# Patient Record
Sex: Male | Born: 2000 | Hispanic: Yes | Marital: Single | Smoking: Never smoker
Health system: Southern US, Community
[De-identification: ages and names within clinical notes are randomized; demographics above are authoritative.]

## PROBLEM LIST (undated history)

## (undated) ENCOUNTER — Ambulatory Visit (HOSPITAL_COMMUNITY): Admission: EM | Payer: Self-pay | Source: Home / Self Care

## (undated) DIAGNOSIS — K219 Gastro-esophageal reflux disease without esophagitis: Secondary | ICD-10-CM

## (undated) HISTORY — DX: Gastro-esophageal reflux disease without esophagitis: K21.9

---
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
## 2021-04-21 ENCOUNTER — Other Ambulatory Visit: Payer: Self-pay

## 2021-04-21 ENCOUNTER — Ambulatory Visit (INDEPENDENT_AMBULATORY_CARE_PROVIDER_SITE_OTHER): Payer: Medicaid Other

## 2021-04-21 DIAGNOSIS — Z23 Encounter for immunization: Secondary | ICD-10-CM

## 2021-04-21 NOTE — Progress Notes (Signed)
    [facility]  Name:  Timothy Pacheco    MRN: [id number] DOB: [birth year]   04/21/2021  Mr. Timothy Pacheco was observed post [facility] immunization for 15 minutes without incident. He was provided with Vaccine Information Sheet and instruction to access the V-Safe system.   Mr. Timothy Pacheco was instructed to call 911 with any severe reactions post vaccine: Difficulty breathing  Swelling of face and throat  A fast heartbeat  A bad rash all over body  Dizziness and weakness

## 2021-06-02 ENCOUNTER — Ambulatory Visit: Payer: Self-pay

## 2021-10-16 ENCOUNTER — Ambulatory Visit (HOSPITAL_COMMUNITY)
Admission: EM | Admit: 2021-10-16 | Discharge: 2021-10-16 | Disposition: A | Discharge status: Home / Self Care | Payer: Medicaid Other | Attending: Internal Medicine | Admitting: Internal Medicine

## 2021-10-16 ENCOUNTER — Ambulatory Visit (INDEPENDENT_AMBULATORY_CARE_PROVIDER_SITE_OTHER): Payer: Medicaid Other

## 2021-10-16 ENCOUNTER — Encounter (HOSPITAL_COMMUNITY): Payer: Self-pay | Admitting: *Deleted

## 2021-10-16 ENCOUNTER — Other Ambulatory Visit: Payer: Self-pay

## 2021-10-16 DIAGNOSIS — J039 Acute tonsillitis, unspecified: Secondary | ICD-10-CM | POA: Diagnosis present

## 2021-10-16 DIAGNOSIS — R634 Abnormal weight loss: Secondary | ICD-10-CM | POA: Diagnosis not present

## 2021-10-16 DIAGNOSIS — R058 Other specified cough: Secondary | ICD-10-CM | POA: Insufficient documentation

## 2021-10-16 DIAGNOSIS — R042 Hemoptysis: Secondary | ICD-10-CM | POA: Insufficient documentation

## 2021-10-16 DIAGNOSIS — Z20822 Contact with and (suspected) exposure to covid-19: Secondary | ICD-10-CM | POA: Diagnosis not present

## 2021-10-16 MED ORDER — BENZONATATE 200 MG PO CAPS: 200.0000 mg | ORAL_CAPSULE | Freq: Three times a day (TID) | ORAL | 0 refills | Status: DC | PRN

## 2021-10-16 MED ORDER — FEXOFENADINE HCL 180 MG PO TABS: 180.0000 mg | ORAL_TABLET | Freq: Every day | ORAL | 0 refills | Status: DC

## 2021-10-16 MED ORDER — PREDNISONE 50 MG PO TABS: ORAL_TABLET | ORAL | 0 refills | Status: DC

## 2021-10-16 NOTE — ED Provider Notes (Signed)
?[facility] ? ? ? ?CSN: [id number] ?Arrival date & time: 10/16/21  1656 ? ? ?  ? ?History   ?Chief Complaint ?Chief Complaint  ?Patient presents with  ? Cough  ? Sore Throat  ? ? ?HPI ?Timothy Pacheco is a 21 y.o. male who presents due to having cough x 2 weeks with clear to light yellow production and this am when brushing his teeth he hit his throat and spit blood with clear mucous. He admits of having wt loss without trying and has had poor appetite in the past week. One night had night sweats. Had mono in January and has recovered fine.  ? ? ? ?History reviewed. No pertinent past medical history. ? ?There are no problems to display for this patient. ? ? ?History reviewed. No pertinent surgical history. ? ? ? ? ?Home Medications   ? ?Prior to Admission medications   ?Medication Sig Start Date End Date Taking? Authorizing Provider  ?benzonatate (TESSALON) 200 MG capsule Take 1 capsule (200 mg total) by mouth 3 (three) times daily as needed for cough. 10/16/21  Yes Rodriguez-Southworth, Nettie Elm, PA-C  ?fexofenadine (ALLEGRA ALLERGY) 180 MG tablet Take 1 tablet (180 mg total) by mouth daily. 10/16/21  Yes Rodriguez-Southworth, Nettie Elm, PA-C  ?predniSONE (DELTASONE) 50 MG tablet One qd 10/16/21  Yes Rodriguez-Southworth, Nettie Elm, PA-C  ? ? ?Family History ?Family History  ?Problem Relation Age of Onset  ? Healthy Father   ? ? ?Social History ?Social History  ? ?Tobacco Use  ? Smoking status: Never  ? Smokeless tobacco: Current  ? Tobacco comments:  ?  Vapes   ?Substance Use Topics  ? Alcohol use: Yes  ? Drug use: Yes  ?  Types: Marijuana  ?  Comment: vapes  ? ? ? ?Allergies   ?Patient has no known allergies. ? ? ?Review of Systems ?Review of Systems  ?Constitutional:  Positive for appetite change and diaphoresis. Negative for activity change, chills and fever.  ?HENT:  Negative for congestion, sore throat and trouble swallowing.   ?Eyes:  Negative for discharge.  ?Respiratory:  Positive for cough.    ?Musculoskeletal:  Negative for myalgias.  ?Skin:  Negative for rash.  ?Neurological:  Negative for headaches.  ? ? ?Physical Exam ?Triage Vital Signs ?ED Triage Vitals  ?Enc Vitals Group  ?   BP 10/16/21 1825 119/80  ?   Pulse Rate 10/16/21 1825 66  ?   Resp 10/16/21 1825 16  ?   Temp 10/16/21 1825 98.1 ?F (36.7 ?C)  ?   Temp src --   ?   SpO2 10/16/21 1825 98 %  ?   Weight --   ?   Height --   ?   Head Circumference --   ?   Peak Flow --   ?   Pain Score 10/16/21 1823 2  ?   Pain Loc --   ?   Pain Edu? --   ?   Excl. in GC? --   ? ?No data found. ? ?Updated Vital Signs ?BP 119/80   Pulse 66   Temp 98.1 ?F (36.7 ?C)   Resp 16   SpO2 98%  ? ?Visual Acuity ?Right Eye Distance:   ?Left Eye Distance:   ?Bilateral Distance:   ? ?Right Eye Near:   ?Left Eye Near:    ?Bilateral Near:    ? ?Physical Exam ?Constitutional:   ?   General: He is not in acute distress. ?   Appearance: He is  normal weight.  ?HENT:  ?   Right Ear: Tympanic membrane and ear canal normal.  ?   Left Ear: Tympanic membrane and ear canal normal.  ?   Mouth/Throat:  ?   Pharynx: Pharyngeal swelling present. No posterior oropharyngeal erythema or uvula swelling.  ?   Tonsils: No tonsillar exudate or tonsillar abscesses. 2+ on the right. 3+ on the left.  ?   Comments: L tonsil is swollen ?Eyes:  ?   Conjunctiva/sclera: Conjunctivae normal.  ?Musculoskeletal:  ?   Cervical back: Neck supple.  ?Lymphadenopathy:  ?   Cervical: No cervical adenopathy.  ?Neurological:  ?   Mental Status: He is alert and oriented to person, place, and time.  ?Psychiatric:     ?   Mood and Affect: Mood normal.     ?   Behavior: Behavior normal.  ? ? ? ?UC Treatments / Results  ?Labs ?(all labs ordered are listed, but only abnormal results are displayed) ?Labs Reviewed  ?SARS CORONAVIRUS 2 (TAT 6-24 HRS)  ? ? ?EKG ? ? ?Radiology ?DG Chest 2 View ? ?Result Date: 10/16/2021 ?CLINICAL DATA:  Throat swelling. EXAM: CHEST - 2 VIEW COMPARISON:  None. FINDINGS: The heart size and  mediastinal contours are within normal limits. Both lungs are clear. The visualized skeletal structures are unremarkable. IMPRESSION: No active cardiopulmonary disease. Electronically Signed   By: Aram Candela M.D.   On: 10/16/2021 19:31   ? ?Procedures ?Procedures (including critical care time) ? ?Medications Ordered in UC ?Medications - No data to display ? ?Initial Impression / Assessment and Plan / UC Course  ?I have reviewed the triage vital signs and the nursing notes. ?Pertinent  imaging results that were available during my care of the patient were reviewed by me and considered in my medical decision making (see chart for details). ?Tonsillitis I believe is from post nasal drainage since they are not red or is in pain' ?Cough with episodic hemoptysis. Normal CXR, must have been just from his tonsil injury.  ?I placed him on Allegra, Tessalon and Prednisone as noted.  ?  ? ? ?Final Clinical Impressions(s) / UC Diagnoses  ? ?Final diagnoses:  ?Acute tonsillitis, unspecified etiology  ?Other cough  ?Hemoptysis  ? ? ? ?Discharge Instructions   ? ?  ?The prednisone is to help the tonsil swelling ?The tessalon is for the cough  ?Your exam does not show any sings of infection ?Stay quarantined until the covid test is back ?The allegra is for post nasal drainage ? ? ? ? ?ED Prescriptions   ? ? Medication Sig Dispense Auth. Provider  ? predniSONE (DELTASONE) 50 MG tablet One qd 5 tablet Rodriguez-Southworth, Nettie Elm, PA-C  ? benzonatate (TESSALON) 200 MG capsule Take 1 capsule (200 mg total) by mouth 3 (three) times daily as needed for cough. 30 capsule Rodriguez-Southworth, Xavion Muscat, PA-C  ? fexofenadine (ALLEGRA ALLERGY) 180 MG tablet Take 1 tablet (180 mg total) by mouth daily. 30 tablet Rodriguez-Southworth, Nettie Elm, PA-C  ? ?  ? ?PDMP not reviewed this encounter. ?  ?Garey Ham, PA-C ?10/16/21 1947 ? ?

## 2021-10-16 NOTE — ED Triage Notes (Signed)
Pt reports cough for 2 weeks . Pt reports having Mono maybe in January per Pt and now has swelling in throat. ?

## 2021-10-16 NOTE — Discharge Instructions (Addendum)
The prednisone is to help the tonsil swelling ?The tessalon is for the cough  ?Your exam does not show any sings of infection ?Stay quarantined until the covid test is back ?The allegra is for post nasal drainage ?

## 2021-10-17 LAB — SARS CORONAVIRUS 2 (TAT 6-24 HRS): SARS Coronavirus 2: NEGATIVE

## 2022-09-12 ENCOUNTER — Encounter (HOSPITAL_COMMUNITY): Payer: Self-pay

## 2022-09-12 ENCOUNTER — Ambulatory Visit (HOSPITAL_COMMUNITY)
Admission: EM | Admit: 2022-09-12 | Discharge: 2022-09-12 | Disposition: A | Discharge status: Home / Self Care | Payer: Medicaid Other | Attending: Family Medicine | Admitting: Family Medicine

## 2022-09-12 DIAGNOSIS — J069 Acute upper respiratory infection, unspecified: Secondary | ICD-10-CM | POA: Diagnosis not present

## 2022-09-12 DIAGNOSIS — U071 COVID-19: Secondary | ICD-10-CM | POA: Insufficient documentation

## 2022-09-12 DIAGNOSIS — R197 Diarrhea, unspecified: Secondary | ICD-10-CM | POA: Diagnosis not present

## 2022-09-12 MED ORDER — ONDANSETRON 4 MG PO TBDP: 4.0000 mg | ORAL_TABLET | Freq: Three times a day (TID) | ORAL | 0 refills | Status: DC | PRN

## 2022-09-12 NOTE — Discharge Instructions (Addendum)
Ondansetron dissolved in the mouth every 8 hours as needed for nausea or vomiting. Clear liquids and bland things to eat.   You have been swabbed for COVID, and the test will result in the next 24 hours. Our staff will call you if positive. If the COVID test is positive, you should quarantine for 5 days from the start of your symptoms.  On days 6-10 from the start of your illness, you should wear a mask if out in public.  DayQuil/NyQuil for your congestion and cough.

## 2022-09-12 NOTE — ED Provider Notes (Signed)
[facility]    CSN: [id number] Arrival date & time: 09/12/22  1721      History   Chief Complaint Chief Complaint  Patient presents with   Fever   Cough   Nasal Congestion    HPI Timothy Pacheco is a 22 y.o. male.    Fever Associated symptoms: cough   Cough Associated symptoms: fever    Here for myalgia and cough and congestion.  Symptoms began yesterday January 30.  He has had some fever but it has gone away as of this morning.  He has had nausea but no vomiting so far.  He has had some diarrhea that he actually thinks is due to being lactose intolerant.  He found out after the fact that he was exposed to someone who later tested positive for COVID.  This exposure was on January 27.  History reviewed. No pertinent past medical history.  There are no problems to display for this patient.   History reviewed. No pertinent surgical history.     Home Medications    Prior to Admission medications   Medication Sig Start Date End Date Taking? Authorizing Provider  ondansetron (ZOFRAN-ODT) 4 MG disintegrating tablet Take 1 tablet (4 mg total) by mouth every 8 (eight) hours as needed for nausea or vomiting. 09/12/22  Yes Raiden Yearwood, Gwenlyn Perking, MD    Family History Family History  Problem Relation Age of Onset   Healthy Father     Social History Social History   Tobacco Use   Smoking status: Never   Smokeless tobacco: Current   Tobacco comments:    Vapes   Vaping Use   Vaping Use: Never used  Substance Use Topics   Alcohol use: Yes   Drug use: Yes    Types: Marijuana    Comment: vapes     Allergies   Patient has no known allergies.   Review of Systems Review of Systems  Constitutional:  Positive for fever.  Respiratory:  Positive for cough.      Physical Exam Triage Vital Signs ED Triage Vitals  Enc Vitals Group     BP 09/12/22 1829 (!) 145/76     Pulse Rate 09/12/22 1829 77     Resp 09/12/22 1829 16     Temp 09/12/22 1829  98.6 F (37 C)     Temp Source 09/12/22 1829 Oral     SpO2 09/12/22 1829 98 %     Weight --      Height --      Head Circumference --      Peak Flow --      Pain Score 09/12/22 1828 0     Pain Loc --      Pain Edu? --      Excl. in [place]? --    No data found.  Updated Vital Signs BP (!) 145/76 (BP Location: Left Arm)   Pulse 77   Temp 98.6 F (37 C) (Oral)   Resp 16   SpO2 98%   Visual Acuity Right Eye Distance:   Left Eye Distance:   Bilateral Distance:    Right Eye Near:   Left Eye Near:    Bilateral Near:     Physical Exam Vitals reviewed.  Constitutional:      General: He is not in acute distress.    Appearance: He is not toxic-appearing.  HENT:     Right Ear: Tympanic membrane and ear canal normal.     Left Ear: Tympanic  membrane and ear canal normal.     Nose: Nose normal.     Mouth/Throat:     Mouth: Mucous membranes are moist.     Comments: There is white and clear mucus draining in the oropharynx.  Tonsils are 2+ hypertrophied, but there is no erythema or asymmetry. Eyes:     Extraocular Movements: Extraocular movements intact.     Conjunctiva/sclera: Conjunctivae normal.     Pupils: Pupils are equal, round, and reactive to light.  Cardiovascular:     Rate and Rhythm: Normal rate and regular rhythm.     Heart sounds: No murmur heard. Pulmonary:     Effort: No respiratory distress.     Breath sounds: No stridor. No wheezing, rhonchi or rales.  Musculoskeletal:     Cervical back: Neck supple.  Lymphadenopathy:     Cervical: No cervical adenopathy.  Skin:    Capillary Refill: Capillary refill takes less than 2 seconds.     Coloration: Skin is not jaundiced or pale.  Neurological:     General: No focal deficit present.     Mental Status: He is alert and oriented to person, place, and time.  Psychiatric:        Behavior: Behavior normal.      UC Treatments / Results  Labs (all labs ordered are listed, but only abnormal results are  displayed) Labs Reviewed  SARS CORONAVIRUS 2 (TAT 6-24 HRS)    EKG   Radiology No results found.  Procedures Procedures (including critical care time)  Medications Ordered in UC Medications - No data to display  Initial Impression / Assessment and Plan / UC Course  I have reviewed the triage vital signs and the nursing notes.  Pertinent labs & imaging results that were available during my care of the patient were reviewed by me and considered in my medical decision making (see chart for details).        COVID swab was done today and if positive he will know if he needs to quarantine.  Zofran is sent for his nausea Final Clinical Impressions(s) / UC Diagnoses   Final diagnoses:  Viral upper respiratory tract infection     Discharge Instructions      Ondansetron dissolved in the mouth every 8 hours as needed for nausea or vomiting. Clear liquids and bland things to eat.   You have been swabbed for COVID, and the test will result in the next 24 hours. Our staff will call you if positive. If the COVID test is positive, you should quarantine for 5 days from the start of your symptoms.  On days 6-10 from the start of your illness, you should wear a mask if out in public.  DayQuil/NyQuil for your congestion and cough.     ED Prescriptions     Medication Sig Dispense Auth. Provider   ondansetron (ZOFRAN-ODT) 4 MG disintegrating tablet Take 1 tablet (4 mg total) by mouth every 8 (eight) hours as needed for nausea or vomiting. 10 tablet Windy Carina Gwenlyn Perking, MD      PDMP not reviewed this encounter.   Barrett Henle, MD 09/12/22 [phone]

## 2022-09-12 NOTE — ED Triage Notes (Signed)
Pt states that started yesterday. Fever, cough and stuffy nose. Was told he wa in contact with someone who had covid. Dayquil and Nyquil.

## 2022-09-13 LAB — SARS CORONAVIRUS 2 (TAT 6-24 HRS): SARS Coronavirus 2: POSITIVE — AB

## 2022-11-05 ENCOUNTER — Ambulatory Visit (HOSPITAL_COMMUNITY)
Admission: EM | Admit: 2022-11-05 | Discharge: 2022-11-05 | Disposition: A | Discharge status: Home / Self Care | Payer: Medicaid Other

## 2022-11-05 ENCOUNTER — Other Ambulatory Visit: Payer: Self-pay

## 2022-11-05 ENCOUNTER — Encounter (HOSPITAL_COMMUNITY): Payer: Self-pay | Admitting: *Deleted

## 2022-11-05 DIAGNOSIS — J351 Hypertrophy of tonsils: Secondary | ICD-10-CM

## 2022-11-05 NOTE — Discharge Instructions (Addendum)
To find a primary care provider go to [email or web address] and follow the prompts to schedule a new patient appointment for primary care.  Someone from [facility] health will be reaching out to you either via MyChart or phone call to help you set up a primary care provider appointment as well.  It is very important to have a primary care provider to manage your overall health and prevent/manage chronic medical conditions.   Once you have your appointment with your primary care provider, they will send a referral to an ear nose and throat provider so that you can be evaluated by a specialist for your persistent tonsil swelling.   No indication for antibiotics or steroids today.  Go to the ER if you have the following symptoms: Can't swallow your saliva or water, severe throat pain with fever, worsening changes in your voice sounds, and inability to swallow.   Return to urgent care as needed.  I hope you feel better!

## 2022-11-05 NOTE — ED Notes (Addendum)
Pt has a scheduled appt for [facility] and [place] [phone] On June 5th at 1:50pm

## 2022-11-05 NOTE — ED Triage Notes (Signed)
Pt reports the tonsils are swollen Left side greater than RT. Pt reports the Lt tonsil blocks food and is the size of a golf ball.

## 2022-11-05 NOTE — ED Provider Notes (Signed)
[facility]    CSN: [id number] Arrival date & time: 11/05/22  1331      History   Chief Complaint Chief Complaint  Patient presents with   Sore Throat    HPI Timothy Pacheco is a 22 y.o. male.   Patient presents to urgent care for evaluation of tonsillar swelling for the last 1 year.  He they state they were seen at urgent care 1 year ago and diagnosed with mono and COVID-19.  Ever since then, tonsils have been swollen especially the left tonsil.  They state the left tonsil has grown in size over the last several months and has started to affect speech.  Reports pain to overlying skin of the anterior neck and is unsure if this is due to tonsil swelling or lymph node swelling.  No recent fever, chills, sore throat, nasal congestion, viral URI symptoms, cough, acid reflux, epigastric abdominal discomfort, nausea, vomiting, inability to maintain secretions, or recent antibiotic/steroid use.  They have not been taking any over-the-counter medications to help with symptoms as the throat does not hurt.  Eating and drinking normally without difficulty.  They state they have noticed changes in their voice sounds when singing and have noticed that voice is more muffled than normal.    Sore Throat    History reviewed. No pertinent past medical history.  There are no problems to display for this patient.   History reviewed. No pertinent surgical history.     Home Medications    Prior to Admission medications   Medication Sig Start Date End Date Taking? Authorizing Provider  ondansetron (ZOFRAN-ODT) 4 MG disintegrating tablet Take 1 tablet (4 mg total) by mouth every 8 (eight) hours as needed for nausea or vomiting. 09/12/22   Windy Carina, Gwenlyn Perking, MD    Family History Family History  Problem Relation Age of Onset   Healthy Father     Social History Social History   Tobacco Use   Smoking status: Never   Smokeless tobacco: Current   Tobacco comments:     Vapes   Vaping Use   Vaping Use: Never used  Substance Use Topics   Alcohol use: Yes   Drug use: Yes    Types: Marijuana    Comment: vapes     Allergies   Patient has no known allergies.   Review of Systems Review of Systems Per HPI  Physical Exam Triage Vital Signs ED Triage Vitals  Enc Vitals Group     BP 11/05/22 1530 117/65     Pulse Rate 11/05/22 1530 74     Resp 11/05/22 1530 18     Temp 11/05/22 1530 98.9 F (37.2 C)     Temp src --      SpO2 11/05/22 1530 95 %     Weight --      Height --      Head Circumference --      Peak Flow --      Pain Score 11/05/22 1528 0     Pain Loc --      Pain Edu? --      Excl. in [place]? --    No data found.  Updated Vital Signs BP 117/65   Pulse 74   Temp 98.9 F (37.2 C)   Resp 18   SpO2 95%   Visual Acuity Right Eye Distance:   Left Eye Distance:   Bilateral Distance:    Right Eye Near:   Left Eye Near:  Bilateral Near:     Physical Exam Vitals and nursing note reviewed.  Constitutional:      Appearance: He is not ill-appearing or toxic-appearing.  HENT:     Head: Normocephalic and atraumatic.     Right Ear: Hearing, tympanic membrane, ear canal and external ear normal.     Left Ear: Hearing, tympanic membrane, ear canal and external ear normal.     Nose: Nose normal.     Mouth/Throat:     Lips: Pink.     Mouth: Mucous membranes are moist. No injury.     Tongue: No lesions. Tongue does not deviate from midline.     Palate: No mass and lesions.     Pharynx: Oropharynx is clear. Uvula midline. No pharyngeal swelling, oropharyngeal exudate, posterior oropharyngeal erythema or uvula swelling.     Tonsils: No tonsillar exudate or tonsillar abscesses. 2+ on the right. 3+ on the left.     Comments: Tonsillar hypertrophy +2 to the right and +3 to the left. Maintaining secretions without drool or spitting. No erythema, posterior oropharyngeal drainage, or tonsillar exudate. Uvula midline, no swelling. Airway  intact. Phonation normal. Eyes:     General: Lids are normal. Vision grossly intact. Gaze aligned appropriately.     Extraocular Movements: Extraocular movements intact.     Conjunctiva/sclera: Conjunctivae normal.  Cardiovascular:     Rate and Rhythm: Normal rate and regular rhythm.     Heart sounds: Normal heart sounds, S1 normal and S2 normal.  Pulmonary:     Effort: Pulmonary effort is normal. No respiratory distress.     Breath sounds: Normal breath sounds and air entry.  Musculoskeletal:     Cervical back: Neck supple.  Lymphadenopathy:     Head:     Right side of head: No submental, submandibular, preauricular, posterior auricular or occipital adenopathy.     Left side of head: No submental, submandibular, preauricular, posterior auricular or occipital adenopathy.     Cervical: No cervical adenopathy.     Right cervical: No superficial, deep or posterior cervical adenopathy.    Left cervical: No superficial, deep or posterior cervical adenopathy.     Upper Body:     Right upper body: No supraclavicular adenopathy.     Left upper body: No supraclavicular adenopathy.  Skin:    General: Skin is warm and dry.     Capillary Refill: Capillary refill takes less than 2 seconds.     Findings: No rash.  Neurological:     General: No focal deficit present.     Mental Status: He is alert and oriented to person, place, and time. Mental status is at baseline.     Cranial Nerves: No dysarthria or facial asymmetry.  Psychiatric:        Mood and Affect: Mood normal.        Speech: Speech normal.        Behavior: Behavior normal.        Thought Content: Thought content normal.        Judgment: Judgment normal.      UC Treatments / Results  Labs (all labs ordered are listed, but only abnormal results are displayed) Labs Reviewed - No data to display  EKG   Radiology No results found.  Procedures Procedures (including critical care time)  Medications Ordered in  UC Medications - No data to display  Initial Impression / Assessment and Plan / UC Course  I have reviewed the triage vital signs and the nursing notes.  Pertinent labs & imaging results that were available during my care of the patient were reviewed by me and considered in my medical decision making (see chart for details).   1. Swelling of tonsil Stable swelling of left tonsil without obstruction, abscess, or infection. No indication for antibiotics or steroid today due to stable HEENT exam findings. Airway intact, no indication for referral to ED for imaging or immediate ENT workup. Patient does not have a PCP and has medicaid. Needs ENT referral, however this needs to be done by PCP. PCP appointment set up in clinic, advised to go to this appointment for further evaluation and management of chronic tonsillar hypertrophy. Strict ER and urgent care return precautions discussed should they develop worsening symptoms.   Discussed physical exam and available lab work findings in clinic with patient.  Counseled patient regarding appropriate use of medications and potential side effects for all medications recommended or prescribed today. Discussed red flag signs and symptoms of worsening condition,when to call the PCP office, return to urgent care, and when to seek higher level of care in the emergency department. Patient verbalizes understanding and agreement with plan. All questions answered. Patient discharged in stable condition.    Final Clinical Impressions(s) / UC Diagnoses   Final diagnoses:  Swelling of tonsil     Discharge Instructions      To find a primary care provider go to [email or web address] and follow the prompts to schedule a new patient appointment for primary care.  Someone from [facility] will be reaching out to you either via MyChart or phone call to help you set up a primary care provider appointment as well.  It is very important to have a primary care  provider to manage your overall health and prevent/manage chronic medical conditions.   Once you have your appointment with your primary care provider, they will send a referral to an ear nose and throat provider so that you can be evaluated by a specialist for your persistent tonsil swelling.   No indication for antibiotics or steroids today.  Go to the ER if you have the following symptoms: Can't swallow your saliva or water, severe throat pain with fever, worsening changes in your voice sounds, and inability to swallow.   Return to urgent care as needed.  I hope you feel better!    ED Prescriptions   None    PDMP not reviewed this encounter.   Talbot Grumbling, [place] 11/05/22 1624

## 2022-11-13 ENCOUNTER — Ambulatory Visit (HOSPITAL_COMMUNITY)
Admission: EM | Admit: 2022-11-13 | Discharge: 2022-11-13 | Disposition: A | Discharge status: Home / Self Care | Payer: Medicaid Other | Attending: Family Medicine | Admitting: Family Medicine

## 2022-11-13 ENCOUNTER — Encounter (HOSPITAL_COMMUNITY): Payer: Self-pay

## 2022-11-13 DIAGNOSIS — K529 Noninfective gastroenteritis and colitis, unspecified: Secondary | ICD-10-CM | POA: Diagnosis not present

## 2022-11-13 LAB — COMPREHENSIVE METABOLIC PANEL
ALT: 23 U/L (ref 0–44)
AST: 16 U/L (ref 15–41)
Albumin: 3.7 g/dL (ref 3.5–5.0)
Alkaline Phosphatase: 80 U/L (ref 38–126)
Anion gap: 9 (ref 5–15)
BUN: 6 mg/dL (ref 6–20)
CO2: 25 mmol/L (ref 22–32)
Calcium: 9.2 mg/dL (ref 8.9–10.3)
Chloride: 103 mmol/L (ref 98–111)
Creatinine, Ser: 0.72 mg/dL (ref 0.61–1.24)
GFR, Estimated: >60 mL/min (ref >60)
Glucose, Bld: 81 mg/dL (ref 70–99)
Potassium: 3.4 mmol/L — ABNORMAL LOW (ref 3.5–5.1)
Sodium: 137 mmol/L (ref 135–145)
Total Bilirubin: 1 mg/dL (ref 0.3–1.2)
Total Protein: 7.4 g/dL (ref 6.5–8.1)

## 2022-11-13 LAB — CBC
HCT: 41.3 % (ref 39.0–52.0)
Hemoglobin: 14.2 g/dL (ref 13.0–17.0)
MCH: 28 pg (ref 26.0–34.0)
MCHC: 34.4 g/dL (ref 30.0–36.0)
MCV: 81.5 fL (ref 80.0–100.0)
Platelets: 228 10*3/uL (ref 150–400)
RBC: 5.07 MIL/uL (ref 4.22–5.81)
RDW: 12.1 % (ref 11.5–15.5)
WBC: 8.2 10*3/uL (ref 4.0–10.5)
nRBC: 0 % (ref 0.0–0.2)

## 2022-11-13 MED ORDER — FAMOTIDINE 20 MG PO TABS: 20.0000 mg | ORAL_TABLET | Freq: Two times a day (BID) | ORAL | 0 refills | Status: DC

## 2022-11-13 MED ORDER — DICYCLOMINE HCL 20 MG PO TABS
20.0000 mg | ORAL_TABLET | Freq: Four times a day (QID) | ORAL | 0 refills | Status: AC | PRN
Start: 2022-11-13 — End: ?

## 2022-11-13 NOTE — ED Triage Notes (Addendum)
Patient reports that he had fever and vomiting 4 days ago and began having diarrhea x 3 with abdominal cramping before and after diarrheal episodes. Patient states he has noted bright red blood on the tissue p[aper and in the water x 2 days.  Patient states he took ibuprofen 4 days ago and took 2 antidiarrheal pills, but states it made him feel bloated so he stopped taking them.

## 2022-11-13 NOTE — Discharge Instructions (Signed)
Take famotidine 20 mg--1 tablet 2 times daily.  This is for stomach acid and acid reflux  Dicyclomine--take 1 every 6 hours as needed for intestinal cramps  It might also help to get some probiotics like align over-the-counter and take pills for a few days.

## 2022-11-13 NOTE — ED Provider Notes (Signed)
[facility]    CSN: [id number] Arrival date & time: 11/13/22  1251      History   Chief Complaint Chief Complaint  Patient presents with   Emesis   Abdominal Pain   Diarrhea    HPI Timothy Pacheco is a 22 y.o. male.    Emesis Associated symptoms: abdominal pain and diarrhea   Abdominal Pain Associated symptoms: diarrhea and vomiting   Diarrhea Associated symptoms: abdominal pain and vomiting    Here for diarrhea and stomach cramps. On March 29, he began having some fever and myalgia and malaise.  He also started having some dyspepsia.  Overnight he started having diarrhea and it was frequent.  Then on March 30 he began having vomiting with it and he vomited and had diarrhea throughout the night of March 30.  By the morning of March 31 he felt much better, was no more fever.  He was able to eat and did not have any diarrhea until yesterday April 1.  Then he started having cramping with diarrhea stool.  He has had a bowel movement about 4 times since yesterday.  He has blood in the stool and he did have some blood in the stool the first couple of days also.  No more nausea.  But he will gag when he is having bowel movements in the last 24 hours.     History reviewed. No pertinent past medical history.  There are no problems to display for this patient.   History reviewed. No pertinent surgical history.     Home Medications    Prior to Admission medications   Medication Sig Start Date End Date Taking? Authorizing Provider  dicyclomine (BENTYL) 20 MG tablet Take 1 tablet (20 mg total) by mouth 4 (four) times daily as needed (intestinal cramps). 11/13/22  Yes Barrett Henle, MD  famotidine (PEPCID) 20 MG tablet Take 1 tablet (20 mg total) by mouth 2 (two) times daily. 11/13/22  Yes Yamile Roedl, Gwenlyn Perking, MD    Family History Family History  Problem Relation Age of Onset   Cancer Father     Social History Social History   Tobacco Use   Smoking  status: Never   Smokeless tobacco: Never   Tobacco comments:    Vapes   Vaping Use   Vaping Use: Never used  Substance Use Topics   Alcohol use: Yes   Drug use: Yes    Types: Marijuana    Comment: vapes     Allergies   Patient has no known allergies.   Review of Systems Review of Systems  Gastrointestinal:  Positive for abdominal pain, diarrhea and vomiting.     Physical Exam Triage Vital Signs ED Triage Vitals  Enc Vitals Group     BP 11/13/22 1443 121/62     Pulse Rate 11/13/22 1443 (!) 54     Resp 11/13/22 1443 14     Temp 11/13/22 1443 98.3 F (36.8 C)     Temp Source 11/13/22 1443 Oral     SpO2 11/13/22 1443 98 %     Weight --      Height --      Head Circumference --      Peak Flow --      Pain Score 11/13/22 1444 2     Pain Loc --      Pain Edu? --      Excl. in [place]? --    No data found.  Updated Vital Signs BP  121/62 (BP Location: Right Arm)   Pulse (!) 54   Temp 98.3 F (36.8 C) (Oral)   Resp 14   SpO2 98%   Visual Acuity Right Eye Distance:   Left Eye Distance:   Bilateral Distance:    Right Eye Near:   Left Eye Near:    Bilateral Near:     Physical Exam Vitals reviewed.  Constitutional:      General: He is not in acute distress.    Appearance: He is not ill-appearing, toxic-appearing or diaphoretic.  HENT:     Nose: Nose normal.     Mouth/Throat:     Mouth: Mucous membranes are moist.     Pharynx: No oropharyngeal exudate or posterior oropharyngeal erythema.  Eyes:     Extraocular Movements: Extraocular movements intact.     Conjunctiva/sclera: Conjunctivae normal.     Pupils: Pupils are equal, round, and reactive to light.  Cardiovascular:     Rate and Rhythm: Normal rate and regular rhythm.     Heart sounds: No murmur heard. Pulmonary:     Effort: No respiratory distress.     Breath sounds: No stridor. No wheezing, rhonchi or rales.  Abdominal:     General: Bowel sounds are normal. There is no distension.     Palpations:  Abdomen is soft.     Tenderness: There is no guarding.     Comments: There is some mild tenderness that is generalized  Musculoskeletal:     Cervical back: Neck supple.  Lymphadenopathy:     Cervical: No cervical adenopathy.  Skin:    Capillary Refill: Capillary refill takes less than 2 seconds.     Coloration: Skin is not jaundiced or pale.  Neurological:     General: No focal deficit present.     Mental Status: He is alert and oriented to person, place, and time.  Psychiatric:        Behavior: Behavior normal.      UC Treatments / Results  Labs (all labs ordered are listed, but only abnormal results are displayed) Labs Reviewed  CBC  COMPREHENSIVE METABOLIC PANEL    EKG   Radiology No results found.  Procedures Procedures (including critical care time)  Medications Ordered in UC Medications - No data to display  Initial Impression / Assessment and Plan / UC Course  I have reviewed the triage vital signs and the nursing notes.  Pertinent labs & imaging results that were available during my care of the patient were reviewed by me and considered in my medical decision making (see chart for details).        I am going to treat for possible postviral gastritis with some Pepcid, and Bentyl is sent in to help with the cramps.  CBC and CMP are drawn today, and we will let him know if any significant abnormalities.  He does not have his primary care appointment until June, but I am still getting him contact information for gastroenterology.  If he worsens in any way he is to proceed to the emergency room. Final Clinical Impressions(s) / UC Diagnoses   Final diagnoses:  Gastroenteritis     Discharge Instructions      Take famotidine 20 mg--1 tablet 2 times daily.  This is for stomach acid and acid reflux  Dicyclomine--take 1 every 6 hours as needed for intestinal cramps  It might also help to get some probiotics like align over-the-counter and take pills for a  few days.     ED  Prescriptions     Medication Sig Dispense Auth. Provider   famotidine (PEPCID) 20 MG tablet Take 1 tablet (20 mg total) by mouth 2 (two) times daily. 30 tablet Lynna Zamorano, Gwenlyn Perking, MD   dicyclomine (BENTYL) 20 MG tablet Take 1 tablet (20 mg total) by mouth 4 (four) times daily as needed (intestinal cramps). 40 tablet Analiyah Lechuga, Gwenlyn Perking, MD      PDMP not reviewed this encounter.   Barrett Henle, MD 11/13/22 [phone]

## 2022-12-06 ENCOUNTER — Encounter (HOSPITAL_COMMUNITY): Payer: Self-pay

## 2022-12-06 ENCOUNTER — Emergency Department (HOSPITAL_COMMUNITY)
Admission: EM | Admit: 2022-12-06 | Discharge: 2022-12-06 | Disposition: A | Discharge status: Home / Self Care | Payer: Medicaid Other | Attending: Emergency Medicine | Admitting: Emergency Medicine

## 2022-12-06 ENCOUNTER — Emergency Department (HOSPITAL_COMMUNITY): Payer: Medicaid Other

## 2022-12-06 ENCOUNTER — Other Ambulatory Visit: Payer: Self-pay

## 2022-12-06 DIAGNOSIS — I880 Nonspecific mesenteric lymphadenitis: Secondary | ICD-10-CM | POA: Insufficient documentation

## 2022-12-06 DIAGNOSIS — K625 Hemorrhage of anus and rectum: Secondary | ICD-10-CM | POA: Diagnosis not present

## 2022-12-06 LAB — CBC WITH DIFFERENTIAL/PLATELET
Abs Immature Granulocytes: 0.02 10*3/uL (ref 0.00–0.07)
Eosinophils Relative: 1 %
HCT: 41.4 % (ref 39.0–52.0)
Hemoglobin: 13.8 g/dL (ref 13.0–17.0)
Immature Granulocytes: 0 %
MCH: 27.7 pg (ref 26.0–34.0)
MCV: 83 fL (ref 80.0–100.0)
Monocytes Absolute: 0.4 10*3/uL (ref 0.1–1.0)
Monocytes Relative: 5 %
Neutro Abs: 4.3 10*3/uL (ref 1.7–7.7)
Neutrophils Relative %: 58 %
RBC: 4.99 MIL/uL (ref 4.22–5.81)
RDW: 12 % (ref 11.5–15.5)
WBC: 7.5 10*3/uL (ref 4.0–10.5)
nRBC: 0 % (ref 0.0–0.2)

## 2022-12-06 LAB — URINALYSIS, ROUTINE W REFLEX MICROSCOPIC
Bilirubin Urine: NEGATIVE
Hgb urine dipstick: NEGATIVE
Protein, ur: NEGATIVE mg/dL
Specific Gravity, Urine: >1.03 — ABNORMAL HIGH (ref 1.005–1.030)
pH: 6 (ref 5.0–8.0)

## 2022-12-06 LAB — COMPREHENSIVE METABOLIC PANEL
ALT: 35 U/L (ref 0–44)
Albumin: 4.2 g/dL (ref 3.5–5.0)
Alkaline Phosphatase: 106 U/L (ref 38–126)
Anion gap: 10 (ref 5–15)
Calcium: 9.4 mg/dL (ref 8.9–10.3)
Chloride: 102 mmol/L (ref 98–111)
Creatinine, Ser: 0.85 mg/dL (ref 0.61–1.24)
GFR, Estimated: >60 mL/min (ref >60)
Glucose, Bld: 108 mg/dL — ABNORMAL HIGH (ref 70–99)
Sodium: 137 mmol/L (ref 135–145)
Total Bilirubin: 0.8 mg/dL (ref 0.3–1.2)

## 2022-12-06 LAB — LIPASE, BLOOD: Lipase: 41 U/L (ref 11–51)

## 2022-12-06 MED ORDER — IOHEXOL 350 MG/ML SOLN
75.0000 mL | Freq: Once | INTRAVENOUS | Status: AC | PRN
  Administered 2022-12-06: 75 mL via INTRAVENOUS

## 2022-12-07 ENCOUNTER — Encounter (HOSPITAL_COMMUNITY): Payer: Self-pay

## 2023-01-15 ENCOUNTER — Telehealth (INDEPENDENT_AMBULATORY_CARE_PROVIDER_SITE_OTHER): Payer: Self-pay | Admitting: Primary Care

## 2023-01-15 NOTE — Telephone Encounter (Signed)
Spoke with pt and reminded them about apt

## 2023-01-16 ENCOUNTER — Ambulatory Visit (INDEPENDENT_AMBULATORY_CARE_PROVIDER_SITE_OTHER): Payer: Medicaid Other | Admitting: Primary Care

## 2023-01-16 ENCOUNTER — Encounter (INDEPENDENT_AMBULATORY_CARE_PROVIDER_SITE_OTHER): Payer: Self-pay | Admitting: Primary Care

## 2023-01-16 VITALS — BP 122/65 | HR 76 | Resp 16 | Ht 69.0 in | Wt 198.4 lb

## 2023-01-16 DIAGNOSIS — Z1159 Encounter for screening for other viral diseases: Secondary | ICD-10-CM

## 2023-01-16 DIAGNOSIS — E663 Overweight: Secondary | ICD-10-CM | POA: Diagnosis not present

## 2023-01-16 DIAGNOSIS — J351 Hypertrophy of tonsils: Secondary | ICD-10-CM

## 2023-01-16 DIAGNOSIS — Z7689 Persons encountering health services in other specified circumstances: Secondary | ICD-10-CM

## 2023-01-16 DIAGNOSIS — Z6825 Body mass index (BMI) 25.0-25.9, adult: Secondary | ICD-10-CM

## 2023-01-16 DIAGNOSIS — Z114 Encounter for screening for human immunodeficiency virus [HIV]: Secondary | ICD-10-CM | POA: Diagnosis not present

## 2023-01-16 NOTE — Patient Instructions (Signed)
HPV and Cancer Information Human papillomavirus (HPV) is a common virus. There are more than 100 types of HPV. Most cases of HPV infections go away on their own within 2 years, but other HPV infections are considered high-risk and may cause changes in cells that could lead to cancer. You can take steps to avoid HPV infection and to lower your risk of getting cancer. How can HPV affect me? Often, HPV infection does not cause any symptoms. However, HPV can cause warts in the genitals (genital or mucosal HPV)or on the hands or feet (cutaneous or nonmucosal HPV). It can also cause wart-like bumps in the throat. Certain types of genital HPV can also cause cancer, which may include: Cancer of the cervix. Cancer of the vagina. Cancer of the outer male genital area (vulva). Cancer of the anus. Cancer of the tongue, tonsils, and throat. Cancer of the penis. How does HPV spread? HPV spreads easily through direct person to person contact. Genital HPV spreads through sexual contact. You can get HPV from vaginal sex, oral sex, anal sex, or just by touching someone's genitals. Even people who have only one sexual partner may have HPV because that partner may have it. HPV often does not cause symptoms, so most infected people do not know that they have it. What actions can I take to prevent HPV?     Take the following steps to help prevent HPV infection: Talk with your health care provider about getting the HPV vaccine. This vaccine protects against the types of HPV that could cause cancer. Limit the number of people you have sex with. Also, avoid having sex with people who have had many sexual partners. Use a new latex or polyurethane condom for each sex act. Talk with your sexual partners about their health. What actions can I take to lower my risk for cancer? Having a healthy lifestyle and taking some preventive steps can help lower your cancer risk, whether or not you have genital HPV. Some steps you  can take include: Lifestyle Do not use any products that contain nicotine or tobacco. These products include cigarettes, chewing tobacco, and vaping devices, such as e-cigarettes. If you need help quitting, ask your provider. Eat healthy foods such as fruits, vegetables, and grains. Try to eat at least 5 servings of fruits and vegetables every day. Get regular exercise. Lose weight if you are overweight. Practice good oral hygiene. This includes flossing and brushing your teeth every day. Other preventive steps Get the HPV vaccine as told by your provider. Practice safe sex. Use a protective barrier, such as a condom, every time you have sex. Get tested for sexually transmitted infections (STIs) even if you do not have symptoms of HPV. You may have HPV and not know it. If you are male, get regular Pap and HPV tests. Talk with your provider about how often you need these tests. Pap tests will help identify changes in cells that can lead to cancer. HPV tests will help detect if there is HPV infection in the cells taken from the cervix. Where to find more information Centers for Disease Control and Prevention: TonerPromos.no National Cancer Institute: cancer.gov American Cancer Society: cancer.org This information is not intended to replace advice given to you by your health care provider. Make sure you discuss any questions you have with your health care provider. Document Revised: 05/18/2022 Document Reviewed: 04/06/2022 Elsevier Patient Education  2024 [employer].

## 2023-01-16 NOTE — Progress Notes (Signed)
New Patient Office Visit  Subjective    Patient ID: Timothy Pacheco, male    DOB: 03/16/2001  Age: 22 y.o. MRN: [id number]  CC:  Chief Complaint  Patient presents with   New Patient (Initial Visit)   Sore Throat    Inflamed Been flamed for over a year      HPI Mr.Timothy Pacheco is a 22 year old who identifies as a male and gay is in a manganous relationship presents to establish care. Discussed interest I prep not at this time. He is concerned about swollen tonsils causing difficulty to swallow or eat at time. Patient has No headache, No chest pain, No abdominal pain - No Nausea, No new weakness tingling or numbness, No Cough - (shortness of breath with exertion)   Outpatient Encounter Medications as of 01/16/2023  Medication Sig   dicyclomine (BENTYL) 20 MG tablet Take 1 tablet (20 mg total) by mouth 4 (four) times daily as needed (intestinal cramps).   famotidine (PEPCID) 20 MG tablet Take 1 tablet (20 mg total) by mouth 2 (two) times daily.   No facility-administered encounter medications on file as of 01/16/2023.    No past medical history on file.  No past surgical history on file.  Family History  Problem Relation Age of Onset   Cancer Father     Social History   Socioeconomic History   Marital status: Single    Spouse name: Not on file   Number of children: Not on file   Years of education: Not on file   Highest education level: Not on file  Occupational History   Not on file  Tobacco Use   Smoking status: Never   Smokeless tobacco: Never   Tobacco comments:    Vapes   Vaping Use   Vaping Use: Never used  Substance and Sexual Activity   Alcohol use: Yes    Comment: occ   Drug use: Yes    Types: Marijuana    Comment: vapes   Sexual activity: Not on file  Other Topics Concern   Not on file  Social History Narrative   ** Merged History Encounter **       Social Determinants of Health   Financial Resource Strain: Not on file  Food  Insecurity: Not on file  Transportation Needs: Not on file  Physical Activity: Not on file  Stress: Not on file  Social Connections: Not on file  Intimate Partner Violence: Not on file    ROS Comprehensive ROS Pertinent positive and negative noted in HPI   Objective    Blood Pressure 122/65   Pulse 76   Respiration 16   Height 5\' 9"  (1.753 m)   Weight 198 lb 6.4 oz (90 kg)   Oxygen Saturation 99%   Body Mass Index 29.30 kg/m   Physical exam: General: Vital signs reviewed.  Patient is well-developed and well-nourished, overweight male in no acute distress and cooperative with exam. Head: Normocephalic and atraumatic. Eyes: EOMI, conjunctivae normal, no scleral icterus. Throat: swollen tonsils left > right Neck: Supple, trachea midline, normal ROM, no JVD, masses, thyromegaly, or carotid bruit present. Cardiovascular: RRR, S1 normal, S2 normal, no murmurs, gallops, or rubs. Pulmonary/Chest: Clear to auscultation bilaterally, no wheezes, rales, or rhonchi. Abdominal: Soft, non-tender, non-distended, BS +, no masses, organomegaly, or guarding present. Musculoskeletal: No joint deformities, erythema, or stiffness, ROM full and nontender. Extremities: No lower extremity edema bilaterally,  pulses symmetric and intact bilaterally. No cyanosis or clubbing. Neurological:  A&O x3, Strength is normal Skin: Warm, dry and intact. No rashes or erythema. Psychiatric: Normal mood and affect. speech and behavior is normal. Cognition and memory are normal.       Assessment & Plan:  Timothy Pacheco was seen today for new patient (initial visit) and sore throat.  Diagnoses and all orders for this visit:  Encounter to establish care  Swollen tonsil  ENT referral   Overweight (BMI 25.0-29.9) Discussed diet and exercise for person with BMI >25. Instructed: You must burn more calories than you eat. Losing 5 percent of your body weight should be considered a success. In the longer term, losing more  than 15 percent of your body weight and staying at this weight is an extremely good result. However, keep in mind that even losing 5 percent of your body weight leads to important health benefits, so try not to get discouraged if you're not able to lose more than this. Will recheck weight in 3-6 months.   Encounter for HCV screening test for low risk patient  Encounter for screening for HIV  Grayce Sessions, NP

## 2023-01-17 LAB — CMP14+EGFR
ALT: 48 IU/L — ABNORMAL HIGH (ref 0–44)
AST: 22 IU/L (ref 0–40)
Albumin/Globulin Ratio: 2 (ref 1.2–2.2)
Albumin: 5.1 g/dL (ref 4.3–5.2)
Alkaline Phosphatase: 120 IU/L (ref 44–121)
BUN/Creatinine Ratio: 17 (ref 9–20)
BUN: 11 mg/dL (ref 6–20)
Bilirubin Total: 0.7 mg/dL (ref 0.0–1.2)
CO2: 23 mmol/L (ref 20–29)
Calcium: 9.9 mg/dL (ref 8.7–10.2)
Chloride: 103 mmol/L (ref 96–106)
Creatinine, Ser: 0.66 mg/dL — ABNORMAL LOW (ref 0.76–1.27)
Globulin, Total: 2.6 g/dL (ref 1.5–4.5)
Glucose: 81 mg/dL (ref 70–99)
Potassium: 4.1 mmol/L (ref 3.5–5.2)
Sodium: 142 mmol/L (ref 134–144)
Total Protein: 7.7 g/dL (ref 6.0–8.5)
eGFR: 137 mL/min/{1.73_m2} (ref >59)

## 2023-01-17 LAB — CBC WITH DIFFERENTIAL/PLATELET
Basophils Absolute: 0 10*3/uL (ref 0.0–0.2)
Basos: 1 %
EOS (ABSOLUTE): 0.1 10*3/uL (ref 0.0–0.4)
Eos: 2 %
Hematocrit: 43.7 % (ref 37.5–51.0)
Hemoglobin: 14.3 g/dL (ref 13.0–17.7)
Immature Grans (Abs): 0 10*3/uL (ref 0.0–0.1)
Immature Granulocytes: 0 %
Lymphocytes Absolute: 2.9 10*3/uL (ref 0.7–3.1)
Lymphs: 47 %
MCH: 27.3 pg (ref 26.6–33.0)
MCHC: 32.7 g/dL (ref 31.5–35.7)
MCV: 84 fL (ref 79–97)
Monocytes Absolute: 0.5 10*3/uL (ref 0.1–0.9)
Monocytes: 8 %
Neutrophils Absolute: 2.5 10*3/uL (ref 1.4–7.0)
Neutrophils: 42 %
Platelets: 226 10*3/uL (ref 150–450)
RBC: 5.23 x10E6/uL (ref 4.14–5.80)
RDW: 13.4 % (ref 11.6–15.4)
WBC: 6 10*3/uL (ref 3.4–10.8)

## 2023-01-17 LAB — LIPID PANEL
Chol/HDL Ratio: 2.7 ratio (ref 0.0–5.0)
Cholesterol, Total: 170 mg/dL (ref 100–199)
HDL: 62 mg/dL (ref >39)
LDL Chol Calc (NIH): 92 mg/dL (ref 0–99)
Triglycerides: 87 mg/dL (ref 0–149)
VLDL Cholesterol Cal: 16 mg/dL (ref 5–40)

## 2023-01-17 LAB — HCV AB W REFLEX TO QUANT PCR: HCV Ab: NONREACTIVE

## 2023-01-17 LAB — HCV INTERPRETATION

## 2023-01-17 LAB — HIV ANTIBODY (ROUTINE TESTING W REFLEX): HIV Screen 4th Generation wRfx: NONREACTIVE

## 2023-03-22 IMAGING — DX DG CHEST 2V
2 series · 2 of 2 positions shown · non-contrast
Comparison: None.

CLINICAL DATA: Throat swelling.

EXAM:
CHEST - 2 VIEW

[chest pa]
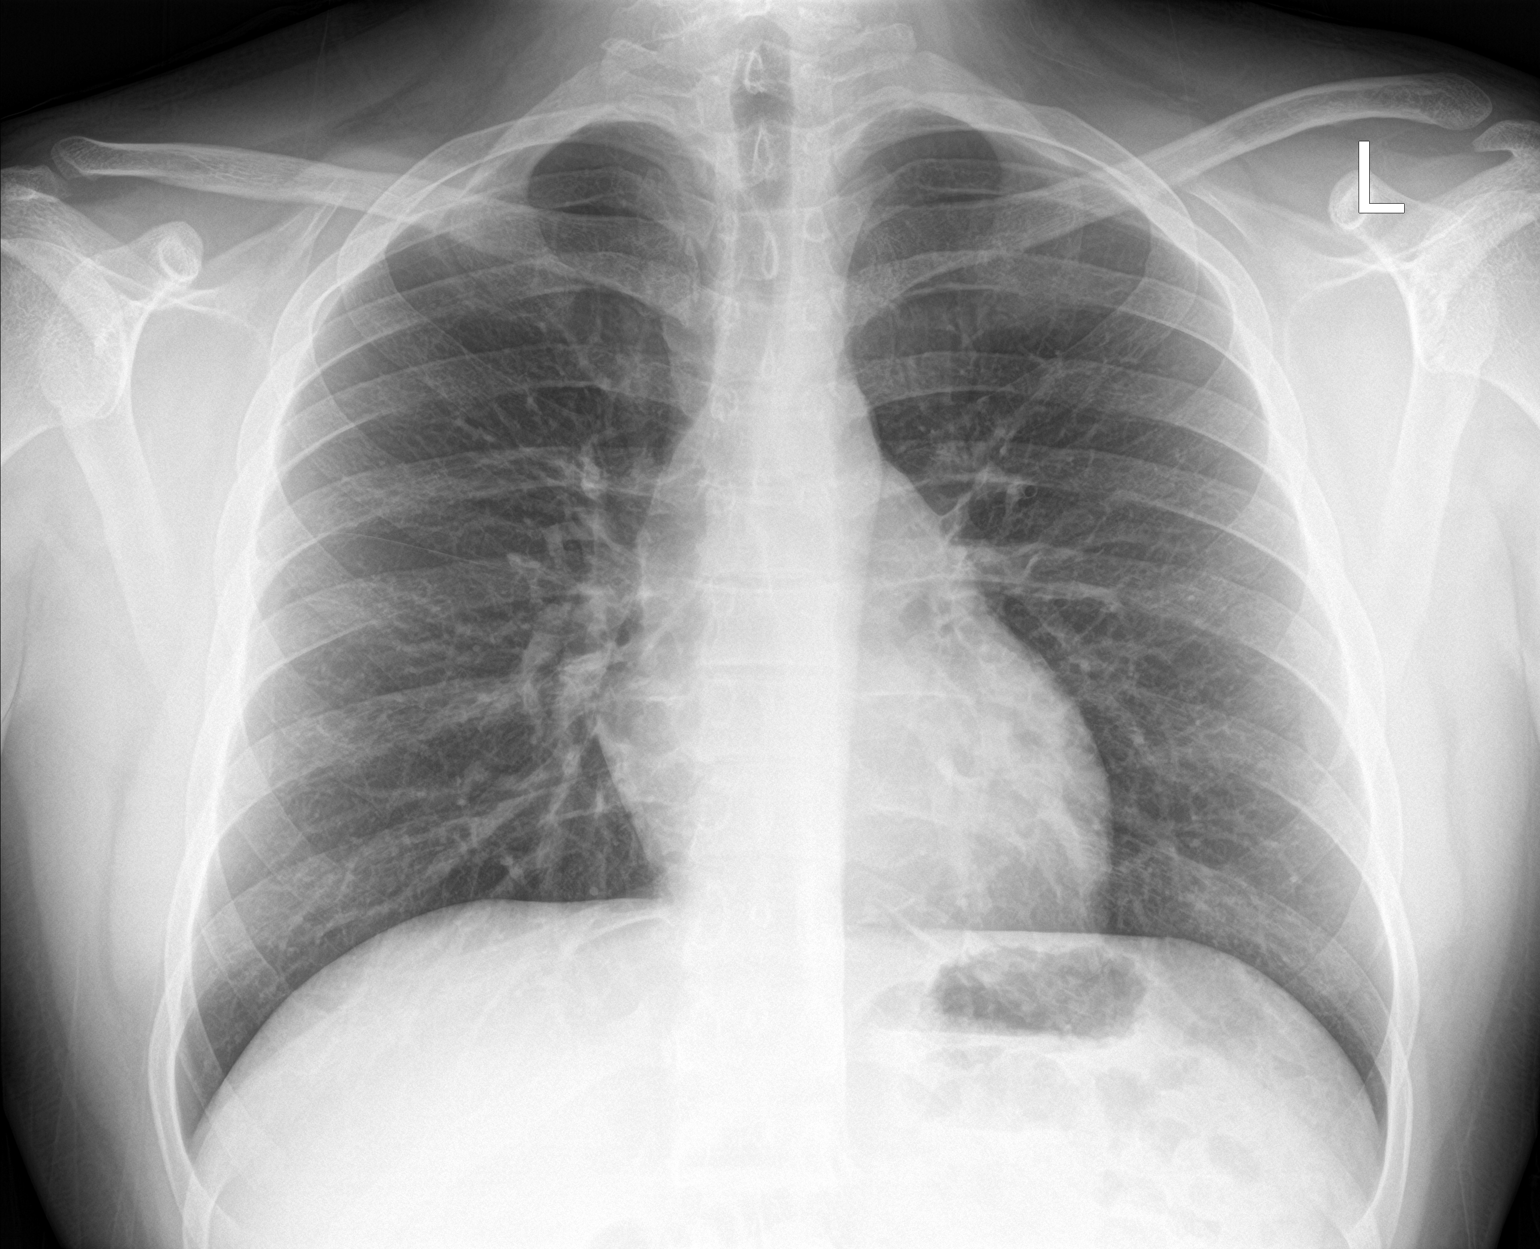

[chest lat]
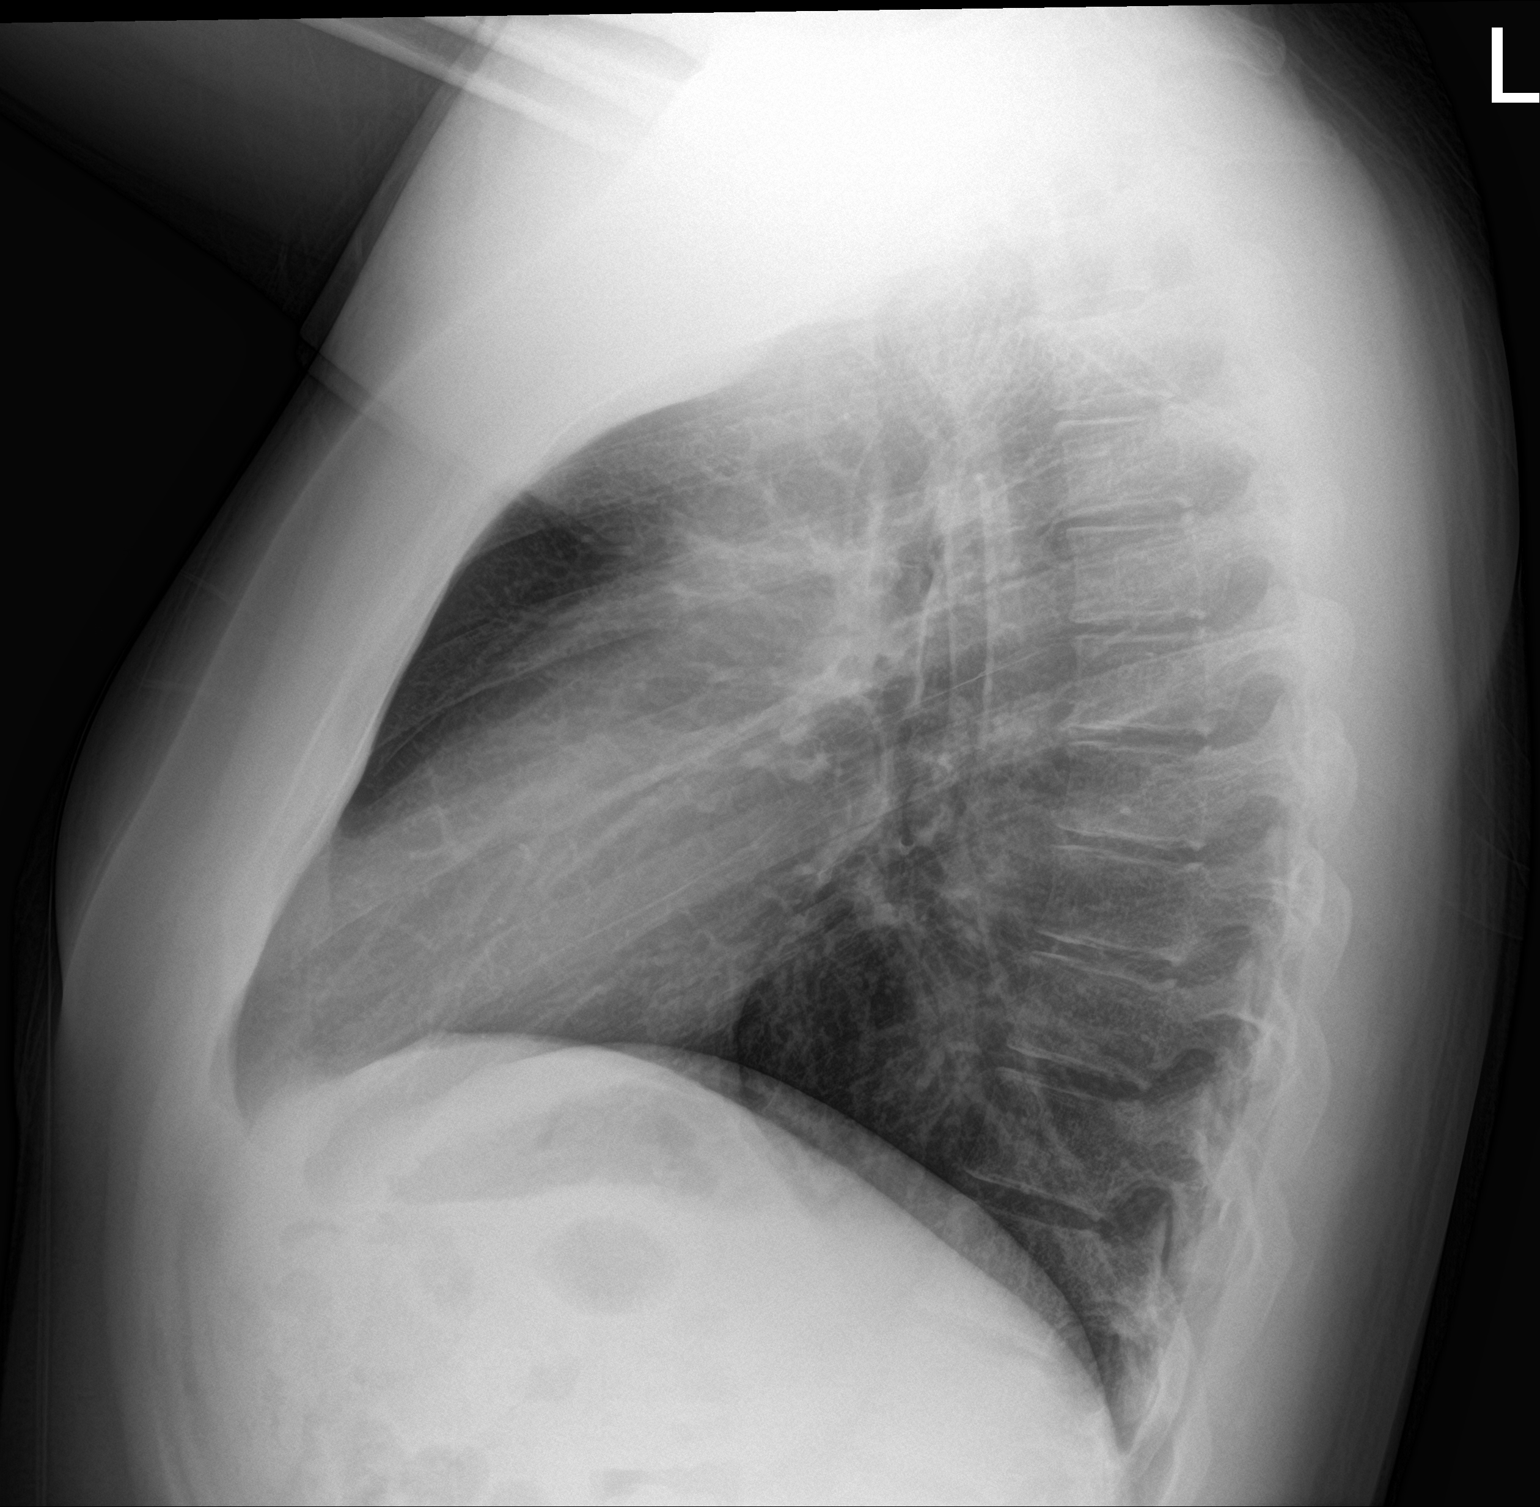

[2 of 2 positions shown; findings below may reference images not displayed]

FINDINGS: The heart size and mediastinal contours are within normal limits.
Both lungs are clear. The visualized skeletal structures are
unremarkable.
IMPRESSION: No active cardiopulmonary disease.

## 2023-04-22 ENCOUNTER — Ambulatory Visit (INDEPENDENT_AMBULATORY_CARE_PROVIDER_SITE_OTHER): Payer: Medicaid Other | Admitting: Primary Care

## 2023-05-02 ENCOUNTER — Ambulatory Visit (INDEPENDENT_AMBULATORY_CARE_PROVIDER_SITE_OTHER): Payer: Medicaid Other | Admitting: Primary Care

## 2023-05-02 VITALS — BP 130/85 | HR 67 | Resp 16 | Ht 69.0 in | Wt 207.6 lb

## 2023-05-02 DIAGNOSIS — Z6830 Body mass index (BMI) 30.0-30.9, adult: Secondary | ICD-10-CM

## 2023-05-02 DIAGNOSIS — J351 Hypertrophy of tonsils: Secondary | ICD-10-CM | POA: Diagnosis not present

## 2023-05-02 DIAGNOSIS — K580 Irritable bowel syndrome with diarrhea: Secondary | ICD-10-CM | POA: Diagnosis not present

## 2023-05-02 DIAGNOSIS — E6609 Other obesity due to excess calories: Secondary | ICD-10-CM

## 2023-05-02 DIAGNOSIS — J039 Acute tonsillitis, unspecified: Secondary | ICD-10-CM

## 2023-05-02 DIAGNOSIS — Z23 Encounter for immunization: Secondary | ICD-10-CM

## 2023-05-02 MED ORDER — RIFAXIMIN 550 MG PO TABS: 550.0000 mg | ORAL_TABLET | Freq: Three times a day (TID) | ORAL | 2 refills | Status: DC

## 2023-05-02 MED ORDER — AMOXICILLIN-POT CLAVULANATE 875-125 MG PO TABS: 1.0000 | ORAL_TABLET | Freq: Two times a day (BID) | ORAL | 0 refills | Status: DC

## 2023-05-02 NOTE — Progress Notes (Addendum)
[facility]  Timothy Pacheco, is a 22 y.o. male  ZOX:[id number]  WJX:[id number]  DOB - 2001-06-14  Chief Complaint  Patient presents with   swollen tonsils       Subjective:   Timothy Pacheco is a 22 y.o. male gay is in a manganous relationship presents to acute visit c/o sore throat, hoarse, eyes watery , nasal congestion,  cough productive and dry. He admits recently was sick  2 weeks ago was sick f with fever chills and bodyaches. He did take a at home COVID test and it was negative.  This lasted approximately 5 days and he did is treated at home with over-the-counter medication: And resolved.  Also partner did not get sick or have any of his symptoms.  Patient has No headache, No chest pain, No abdominal pain - No Nausea, No new weakness tingling or numbness, - shortness of breath.  Note given  No problems updated.  No Known Allergies  No past medical history on file.  Current Outpatient Medications on File Prior to Visit  Medication Sig Dispense Refill   dicyclomine (BENTYL) 20 MG tablet Take 1 tablet (20 mg total) by mouth 4 (four) times daily as needed (intestinal cramps). (Patient not taking: Reported on 05/02/2023) 40 tablet 0   famotidine (PEPCID) 20 MG tablet Take 1 tablet (20 mg total) by mouth 2 (two) times daily. (Patient not taking: Reported on 05/02/2023) 30 tablet 0   No current facility-administered medications on file prior to visit.    Objective:   Vitals:   05/02/23 1007 05/02/23 1010  BP: 135/87 130/85  Pulse: 67 67  Resp: 16 16  SpO2: 97% 97%  Weight: 207 lb 9.6 oz (94.2 kg)   Height: 5\' 9"  (1.753 m)     Comprehensive ROS Pertinent positive and negative noted in HPI   Exam General appearance : Awake, alert, not in any distress. Speech Clear. Not toxic looking HEENT: Atraumatic and Normocephalic, pupils equally reactive to light and accomodation Neck: Supple, no JVD. No cervical lymphadenopathy. Bilateral swollen  red tonsils left > right  Chest: Good air entry bilaterally, no added sounds  CVS: S1 S2 regular, no murmurs.  Abdomen: Bowel sounds present, Non tender and not distended with no gaurding, rigidity or rebound. Extremities: B/L Lower Ext shows no edema, both legs are warm to touch Neurology: Awake alert, and oriented X 3,  Non focal Skin: No Rash  Data Review No results found for: "HGBA1C"  Assessment & Plan  Jillian was seen today for swollen tonsils.  Diagnoses and all orders for this visit:  Encounter for immunization -     Flu vaccine trivalent PF, 6mos and older(Flulaval,Afluria,Fluarix,Fluzone)  Encounter for immunization -     Tdap vaccine greater than or equal to 7yo IM  Swollen tonsil 2/2 Acute tonsillitis, unspecified etiology amoxicillin-clavulanate (AUGMENTIN) 875-125 MG tablet  Class 1 obesity due to excess calories without serious comorbidity with body mass index (BMI) of 30.0 to 30.9 in adult  Obesity is 30-39 indicating an excess in caloric intake or underlining conditions. This may lead to other co-morbidities. Educated on lifestyle modifications of diet and exercise which may reduce obesity.    Irritable bowel syndrome with diarrhea  Will do a trial of Xifaxan 550 mg tid and no improvement will refer to GI    This note has been created with Education officer, environmental. Any transcriptional errors are unintentional.   Grayce Sessions, NP 05/02/2023,  10:55 AM

## 2023-05-02 NOTE — Patient Instructions (Signed)
HPV Vaccine Information for Parents  Human papillomavirus (HPV) is a common virus. It spreads easily from person to person through skin-to-skin or sexual contact. There are many types of HPV viruses. Genital or mucosal HPV can cause warts in the genitals. Cutaneous or nonmucosal HPV can cause warts on the hands or feet. Some genital HPV types may cause cancer. Your child can get a shot to help prevent the HPV types that can cause cancer, genital warts, or warts near the opening of the butt (anus). The vaccine is safe and effective. It is recommended that your child get the vaccine at about 5-80 years of age. Getting the vaccine before your child is sexually active gives them the best protection from HPV through adulthood. How can HPV affect my child? An infection with HPV can cause: Genital warts. Mouth or throat cancer. Cancer of the anus. Cancer of the lowest part of the uterus (cervix), outer male genital area (vulva), or vagina. Cancer of the penis (penile cancer). During pregnancy, HPV can be passed to the baby. This infection can cause warts to form in the baby's throat and mouth. What actions can I take to lower my child's risk for HPV? Have your child get the HPV vaccine before they become sexually active. The best time to get the shot is at around 58-61 years of age. The vaccine may be given to children as young as 40 years old.  If your child gets the vaccine before they are 58 years old, it can be given as 2 shots, 6-12 months apart. In some cases, 3 doses are needed. Your child may need 3 doses if: They get the first dose before they are 21 years old but do not have a second dose within 6-12 months after the first dose. They get their first dose after they are 21 years old. They will need to get the other 2 doses within 6 months of the first dose. They have a weak body defense system (immune system). What are the risks and benefits of the HPV vaccine? Benefits Getting the vaccine  can help prevent certain cancers. These include: Oral cancer. This is cancer of the mouth. Anal cancer. This is cancer of the anus. Cancers of the cervix, vulva, and vagina in females. Penile cancer in males. Your child is less likely to get these cancers if they get the vaccine before they become sexually active. The vaccine also prevents genital warts caused by HPV. Risks In rare cases, side effects and reactions have been reported. These include: Soreness, redness, or swelling at the injection site. Dizziness or fainting. Fever. Headache. Muscle or joint pain. Who should not get the HPV vaccine or wait to get it? Some children should not get the HPV vaccine or should wait to get it. Ask the health care provider if your child should get the vaccine if: Your child has had a severe allergic reaction to other vaccines. Your child is allergic to yeast. Your child has a fever. Your child has had a recent illness. Your child is pregnant or may be pregnant. Where to find more information Centers for Disease Control and Prevention (CDC): TonerPromos.no American Academy of Pediatrics (AAP): healthychildren.org This information is not intended to replace advice given to you by your health care provider. Make sure you discuss any questions you have with your health care provider. Document Revised: 04/27/2022 Document Reviewed: 04/27/2022 Elsevier Patient Education  2024 Elsevier Inc.   Td (Tetanus, Diphtheria) Vaccine: What You Need to Know Many  vaccine information statements are available in Spanish and other languages. See PromoAge.com.br. 1. Why get vaccinated? Td vaccine can prevent tetanus and diphtheria. Tetanus enters the body through cuts or wounds. Diphtheria spreads from person to person. TETANUS (T) causes painful stiffening of the muscles. Tetanus can lead to serious health problems, including being unable to open the mouth, having trouble swallowing and breathing, or  death. DIPHTHERIA (D) can lead to difficulty breathing, heart failure, paralysis, or death. 2. Td vaccine Td is only for children 7 years and older, adolescents, and adults.  Td is usually given as a booster dose every 10 years, or after 5 years in the case of a severe or dirty wound or burn. Another vaccine, called "Tdap," may be used instead of Td. Tdap protects against pertussis, also known as "whooping cough," in addition to tetanus and diphtheria. Td may be given at the same time as other vaccines. 3. Talk with your health care provider Tell your vaccination provider if the person getting the vaccine: Has had an allergic reaction after a previous dose of any vaccine that protects against tetanus or diphtheria, or has any severe, life-threatening allergies Has ever had Guillain-Barr Syndrome (also called "GBS") Has had severe pain or swelling after a previous dose of any vaccine that protects against tetanus or diphtheria In some cases, your health care provider may decide to postpone Td vaccination until a future visit. People with minor illnesses, such as a cold, may be vaccinated. People who are moderately or severely ill should usually wait until they recover before getting Td vaccine.  Your health care provider can give you more information. 4. Risks of a vaccine reaction Pain, redness, or swelling where the shot was given, mild fever, headache, feeling tired, and nausea, vomiting, diarrhea, or stomachache sometimes happen after Td vaccination. People sometimes faint after medical procedures, including vaccination. Tell your provider if you feel dizzy or have vision changes or ringing in the ears.  As with any medicine, there is a very remote chance of a vaccine causing a severe allergic reaction, other serious injury, or death. 5. What if there is a serious problem? An allergic reaction could occur after the vaccinated person leaves the clinic. If you see signs of a severe allergic  reaction (hives, swelling of the face and throat, difficulty breathing, a fast heartbeat, dizziness, or weakness), call 9-1-1 and get the person to the nearest hospital.  For other signs that concern you, call your health care provider.  Adverse reactions should be reported to the Vaccine Adverse Event Reporting System (VAERS). Your health care provider will usually file this report, or you can do it yourself. Visit the VAERS website at www.vaers.LAgents.no or call 8720940482. VAERS is only for reporting reactions, and VAERS staff members do not give medical advice. 6. The National Vaccine Injury Compensation Program The [employer] Vaccine Injury Compensation Program (VICP) is a federal program that was created to compensate people who may have been injured by certain vaccines. Claims regarding alleged injury or death due to vaccination have a time limit for filing, which may be as short as two years. Visit the VICP website at SpiritualWord.at or call (631)296-2827 to learn about the program and about filing a claim. 7. How can I learn more? Ask your health care provider. Call your local or state health department. Visit the website of the Food and Drug Administration (FDA) for vaccine package inserts and additional information at FinderList.no. Contact the Centers for Disease Control and Prevention (CDC): Call  435-620-6778 (1-800-CDC-INFO) or Visit CDC's website at PicCapture.uy. Source: CDC Vaccine Information Statement Td (Tetanus, Diphtheria) Vaccine (03/18/2020) This same material is available at FootballExhibition.com.br for no charge. This information is not intended to replace advice given to you by your health care provider. Make sure you discuss any questions you have with your health care provider. Document Revised: 11/14/2022 Document Reviewed: 09/14/2022 Elsevier Patient Education  2024 Elsevier Inc.   Influenza Vaccine Injection What is this  medication? INFLUENZA VACCINE (in floo EN zuh vak SEEN) reduces the risk of the influenza (flu). It does not treat influenza. It is still possible to get influenza after receiving this vaccine, but the symptoms may be less severe or not last as long. It works by helping your immune system learn how to fight off a future infection. This medicine may be used for other purposes; ask your health care provider or pharmacist if you have questions. COMMON BRAND NAME(S): Afluria Quadrivalent, FLUAD Quadrivalent, Fluarix Quadrivalent, Flublok Quadrivalent, FLUCELVAX Quadrivalent, Flulaval Quadrivalent, Fluzone Quadrivalent What should I tell my care team before I take this medication? They need to know if you have any of these conditions: Bleeding disorder like hemophilia Fever or infection Guillain-Barre syndrome or other neurological problems Immune system problems Infection with the human immunodeficiency virus (HIV) or AIDS Low blood platelet counts Multiple sclerosis An unusual or allergic reaction to influenza virus vaccine, latex, other medications, foods, dyes, or preservatives. Different brands of vaccines contain different allergens. Some may contain latex or eggs. Talk to your care team about your allergies to make sure that you get the right vaccine. Pregnant or trying to get pregnant Breastfeeding How should I use this medication? This vaccine is injected into a muscle or under the skin. It is given by your care team. A copy of Vaccine Information Statements will be given before each vaccination. Be sure to read this sheet carefully each time. This sheet may change often. Talk to your care team to see which vaccines are right for you. Some vaccines should not be used in all age groups. Overdosage: If you think you have taken too much of this medicine contact a poison control center or emergency room at once. NOTE: This medicine is only for you. Do not share this medicine with others. What  if I miss a dose? This does not apply. What may interact with this medication? Certain medications that lower your immune system, such as etanercept, anakinra, infliximab, adalimumab Certain medications that prevent or treat blood clots, such as warfarin Chemotherapy or radiation therapy Phenytoin Steroid medications, such as prednisone or cortisone Theophylline Vaccines This list may not describe all possible interactions. Give your health care provider a list of all the medicines, herbs, non-prescription drugs, or dietary supplements you use. Also tell them if you smoke, drink alcohol, or use illegal drugs. Some items may interact with your medicine. What should I watch for while using this medication? Report any side effects that do not go away with your care team. Call your care team if any unusual symptoms occur within 6 weeks of receiving this vaccine. You may still catch the flu, but the illness is not usually as bad. You cannot get the flu from the vaccine. The vaccine will not protect against colds or other illnesses that may cause fever. The vaccine is needed every year. What side effects may I notice from receiving this medication? Side effects that you should report to your care team as soon as possible: Allergic reactions--skin rash, itching, hives,  swelling of the face, lips, tongue, or throat Side effects that usually do not require medical attention (report these to your care team if they continue or are bothersome): Chills Fatigue Headache Joint pain Loss of appetite Muscle pain Nausea Pain, redness, or irritation at injection site This list may not describe all possible side effects. Call your doctor for medical advice about side effects. You may report side effects to FDA at 1-800-FDA-1088. Where should I keep my medication? The vaccine is only given by your care team. It will not be stored at home. NOTE: This sheet is a summary. It may not cover all possible  information. If you have questions about this medicine, talk to your doctor, pharmacist, or health care provider.  2024 Elsevier/Gold Standard (2022-01-09 00:00:00)

## 2023-07-22 ENCOUNTER — Ambulatory Visit (INDEPENDENT_AMBULATORY_CARE_PROVIDER_SITE_OTHER): Payer: Medicaid Other | Admitting: Primary Care

## 2023-07-22 ENCOUNTER — Encounter (INDEPENDENT_AMBULATORY_CARE_PROVIDER_SITE_OTHER): Payer: Self-pay | Admitting: Primary Care

## 2023-07-22 VITALS — BP 108/75 | HR 61 | Resp 16 | Wt 217.4 lb

## 2023-07-22 DIAGNOSIS — K219 Gastro-esophageal reflux disease without esophagitis: Secondary | ICD-10-CM

## 2023-07-22 DIAGNOSIS — Z5941 Food insecurity: Secondary | ICD-10-CM | POA: Diagnosis not present

## 2023-07-22 DIAGNOSIS — K921 Melena: Secondary | ICD-10-CM

## 2023-07-22 MED ORDER — OMEPRAZOLE 20 MG PO CPDR: 20.0000 mg | DELAYED_RELEASE_CAPSULE | Freq: Every day | ORAL | 3 refills | Status: DC

## 2023-07-22 NOTE — Progress Notes (Signed)
[facility]  Timothy Pacheco, is a 22 y.o. male  ZOX:[id number]  WJX:[id number]  DOB - 01-30-2001  Chief Complaint  Patient presents with   Abdominal Pain       Subjective:   Timothy Pacheco is a 22 y.o. male here today for a follow up visit. Patient has No headache, No chest pain, No abdominal pain - No Nausea, No new weakness tingling or numbness, No Cough - shortness of breath Abdominal Pain This is a new problem. The current episode started in the past 7 days. The onset quality is sudden. The problem occurs daily. The problem has been gradually worsening since onset. The pain is located in the LLQ. The pain is at a severity of 5/10. The pain is moderate. The quality of the pain is described as cramping. The pain does not radiate. Associated symptoms include anxiety, diarrhea, flatus, hematochezia and nausea. The symptoms are relieved by bowel movements. He consumes caffeinated beverages and carbonated beverages. Past treatments include nothing. Significant past medical history includes GERD.  Not sexually active in 2 months due to stomach issueMissing meals due to affordability d/w applying for food stamps.  No problems updated.  Comprehensive ROS Pertinent positive and negative noted in HPI   No Known Allergies  History reviewed. No pertinent past medical history.  Current Outpatient Medications on File Prior to Visit  Medication Sig Dispense Refill   amoxicillin-clavulanate (AUGMENTIN) 875-125 MG tablet Take 1 tablet by mouth 2 (two) times daily. 10 tablet 0   dicyclomine (BENTYL) 20 MG tablet Take 1 tablet (20 mg total) by mouth 4 (four) times daily as needed (intestinal cramps). (Patient not taking: Reported on 05/02/2023) 40 tablet 0   famotidine (PEPCID) 20 MG tablet Take 1 tablet (20 mg total) by mouth 2 (two) times daily. (Patient not taking: Reported on 05/02/2023) 30 tablet 0   rifaximin (XIFAXAN) 550 MG TABS tablet Take 1 tablet (550 mg  total) by mouth 3 (three) times daily. 42 tablet 2   No current facility-administered medications on file prior to visit.   Health Maintenance  Topic Date Due   COVID-19 Vaccine (1 - 2023-24 season) Never done   HPV Vaccine (2 - Male 3-dose series) 05/30/2023   DTaP/Tdap/Td vaccine (2 - Td or Tdap) 05/01/2033   Flu Shot  Completed   Hepatitis C Screening  Completed   HIV Screening  Completed    Objective:     Physical Exam Vitals reviewed.  Constitutional:      Appearance: He is obese.  HENT:     Head: Normocephalic.     Right Ear: External ear normal.     Left Ear: External ear normal.     Nose: Nose normal.  Eyes:     Extraocular Movements: Extraocular movements intact.  Cardiovascular:     Rate and Rhythm: Normal rate and regular rhythm.  Pulmonary:     Effort: Pulmonary effort is normal.     Breath sounds: Normal breath sounds.  Abdominal:     General: Bowel sounds are normal. There is distension.  Musculoskeletal:        General: Normal range of motion.     Cervical back: Normal range of motion and neck supple.  Skin:    General: Skin is warm and dry.  Neurological:     Mental Status: He is alert and oriented to person, place, and time.  Psychiatric:        Mood and Affect: Mood normal.  Behavior: Behavior normal.     Assessment & Plan  Diagnoses and all orders for this visit:  Gastroesophageal reflux disease without esophagitis -     omeprazole (PRILOSEC) 20 MG capsule; Take 1 capsule (20 mg total) by mouth daily.  Hematochezia -     Cancel: Ambulatory referral to Gastroenterology -     Ambulatory referral to Gastroenterology -     CBC with Differential     Patient have been counseled extensively about nutrition and exercise. Other issues discussed during this visit include: low cholesterol diet, weight control and daily exercise, foot care, annual eye examinations at Ophthalmology, importance of adherence with medications and regular  follow-up. We also discussed long term complications of uncontrolled diabetes and hypertension.   Return if symptoms worsen or fail to improve.  The patient was given clear instructions to go to ER or return to medical center if symptoms don't improve, worsen or new problems develop. The patient verbalized understanding. The patient was told to call to get lab results if they haven't heard anything in the next week.   This note has been created with Education officer, environmental. Any transcriptional errors are unintentional.   Grayce Sessions, NP 07/22/2023, 5:00 PM

## 2023-07-22 NOTE — Patient Instructions (Signed)
Discussed eating small frequent meal, reduction in acidic foods, fried foods ,spicy foods, alcohol caffeine and tobacco and certain medications. Avoid laying down after eating 35mins-1hour, elevated head of the bed.

## 2023-07-23 LAB — CBC WITH DIFFERENTIAL/PLATELET
Basophils Absolute: 0 10*3/uL (ref 0.0–0.2)
Basos: 1 %
EOS (ABSOLUTE): 0.1 10*3/uL (ref 0.0–0.4)
Eos: 1 %
Hematocrit: 43.3 % (ref 37.5–51.0)
Hemoglobin: 14.4 g/dL (ref 13.0–17.7)
Immature Grans (Abs): 0 10*3/uL (ref 0.0–0.1)
Immature Granulocytes: 0 %
Lymphocytes Absolute: 2.8 10*3/uL (ref 0.7–3.1)
Lymphs: 37 %
MCH: 27.6 pg (ref 26.6–33.0)
MCHC: 33.3 g/dL (ref 31.5–35.7)
MCV: 83 fL (ref 79–97)
Monocytes Absolute: 0.5 10*3/uL (ref 0.1–0.9)
Monocytes: 7 %
Neutrophils Absolute: 4.2 10*3/uL (ref 1.4–7.0)
Neutrophils: 54 %
Platelets: 223 10*3/uL (ref 150–450)
RBC: 5.21 x10E6/uL (ref 4.14–5.80)
RDW: 13 % (ref 11.6–15.4)
WBC: 7.6 10*3/uL (ref 3.4–10.8)

## 2023-08-02 ENCOUNTER — Other Ambulatory Visit (INDEPENDENT_AMBULATORY_CARE_PROVIDER_SITE_OTHER): Payer: Self-pay | Admitting: Primary Care

## 2023-08-06 ENCOUNTER — Ambulatory Visit: Payer: Medicaid Other | Admitting: Licensed Clinical Social Worker

## 2023-08-15 ENCOUNTER — Other Ambulatory Visit: Payer: Self-pay | Admitting: Licensed Clinical Social Worker

## 2023-08-15 NOTE — Patient Instructions (Signed)
 Visit Information  Mr. Quashaun Lazalde was given information about Medicaid Managed Care team care coordination services as a part of their [employer] Medicaid benefit. Lathen Galan Hernandes verbally consentedto engagement with the [facility] team.   If you are experiencing a medical emergency, please call 911 or report to your local emergency department or urgent care.   If you have a non-emergency medical problem during routine business hours, please contact your provider's office and ask to speak with a nurse.   For questions related to your [employer] [facility] health plan, please call: [phone]  OR visit the member homepage at: [email or web address]  If you would like to schedule transportation through your [employer] [facility] plan, please call the following number at least 2 days in advance of your appointment: [phone]  If you are experiencing a behavioral health crisis, call the [employer] [place]  Behavioral Health Crisis Line at [phone] [phone]). The line is available 24 hours a day, seven days a week.  If you would like help to quit smoking, call 1-800-QUIT-NOW ((301)545-1161) OR Espaol: 1-855-Djelo-Ya (8-144-664-6430) o para ms informacin haga clic aqu or Text READY to 799-599 to register via text     24- Hour Availability:    [facility]  [address] [place], [place] [phone] Crisis [phone]   [facility] 6406365750  [facility]  [phone]    [facility]  [phone] (after hours)   Therapeutic Alternative/Mobile Crisis   320-649-0614   [place]  National Suicide Hotline  985-207-0406 MERRILYN) OR 988   Call 7375711273 for mental health emergencies   [facility]  [phone]);  [place] and [employer]   [phone]); [place], [place], [place], [place], [place], [place], [place]    [facility] Residents For 24/7 walk-up access to mental health services for [facility] children (4+), adolescents and adults, please visit the [facility] located at [address] in [place], [place].  *[facility] also provides comprehensive outpatient behavioral health services in a variety of locations around the [place].  Connect With Us  [address] [place], [place] [address] HelpLine: 818-868-8656 or 1-807-265-2468  Get Directions  Find Help 24/7 By Phone Call our 24-hour HelpLine at (217)276-0847 or (223)751-3232 for immediate assistance for mental health and substance abuse issues.  Walk-In Help [place]: [facility] (Ages 4 and Up) [place]: Emergency Dept., [facility] Additional Resources National Hopeline Network: 1-800-SUICIDE The National Suicide Prevention Lifeline: 8-199-726-UJOX      Lyle Rung, BSW, MSW, LCSW Licensed Clinical Social Worker [employer]   [email or web address] Direct Dial: [phone]

## 2023-08-15 NOTE — Patient Outreach (Signed)
  Medicaid Managed Care Social Work Note  08/15/2023 Name:  Timothy Pacheco MRN:  [id number] DOB:  September 04, 2000  Timothy Pacheco is an 23 y.o. year old male who is a primary Timothy Pacheco of Celestia Rosaline SQUIBB, NP.  The [facility] Managed Care Coordination team was consulted for assistance with:  Food Insecurity  Timothy Pacheco was given information about Medicaid Managed Care Coordination team services today. Timothy Pacheco Timothy Pacheco agreed to services and verbal consent obtained.  Engaged with Timothy Pacheco  for by telephone forinitial visit in response to referral for case management and/or care coordination services.   Timothy Pacheco is participating in a Managed Medicaid Plan:  Yes  Assessments/Interventions:  Review of past medical history, allergies, medications, health status, including review of consultants reports, laboratory and other test data, was performed as part of comprehensive evaluation and provision of chronic care management services.  SDOH: (Social Drivers of Health) assessments and interventions performed: SDOH Interventions    Flowsheet Row Timothy Pacheco Outreach Telephone from 08/15/2023 in [facility] Office Visit from 07/22/2023 in [facility]  SDOH Interventions    Food Insecurity Interventions Community Resources Provided AMB Referral  Housing Interventions -- AMB Referral  Transportation Interventions -- Intervention Not Indicated  Utilities Interventions -- Intervention Not Indicated       Advanced Directives Status:  Not addressed in this encounter.  Care Plan                 No Known Allergies  Medications Reviewed Today   Medications were not reviewed in this encounter     There are no active problems to display for this Timothy Pacheco.   Timothy Pacheco reports only needing information on how to apply for food stamps in his area. He denies any further case management needs at this time. He is agreeable to  [facility] LCSW emailing him a list of local food support resources, food stamps application and the ePASS website and instructions on how to apply online. Timothy Pacheco appreciative of education and resource support. Timothy Pacheco denies any further social work needs at this time. Timothy Pacheco agreeable to contact this [facility] LCSW directly if future social needs were to arise. [facility] LCSW will sign off at this time.   Follow up:  Timothy Pacheco requests no follow-up at this time.  Plan: The Managed Medicaid care management team is available to follow up with the Timothy Pacheco after provider conversation with the Timothy Pacheco regarding recommendation for care management engagement and subsequent re-referral to the care management team.   Lyle Rung, BSW, MSW, LCSW Licensed Clinical Social Worker [place]   [facility] [email or web address] Direct Dial: [phone]

## 2023-09-16 ENCOUNTER — Encounter: Payer: Self-pay | Admitting: Gastroenterology

## 2023-11-14 ENCOUNTER — Ambulatory Visit (INDEPENDENT_AMBULATORY_CARE_PROVIDER_SITE_OTHER): Payer: Medicaid Other | Admitting: Gastroenterology

## 2023-11-14 ENCOUNTER — Encounter: Payer: Self-pay | Admitting: Gastroenterology

## 2023-11-14 ENCOUNTER — Other Ambulatory Visit (INDEPENDENT_AMBULATORY_CARE_PROVIDER_SITE_OTHER)

## 2023-11-14 VITALS — BP 122/68 | HR 73 | Ht 68.0 in | Wt 211.0 lb

## 2023-11-14 DIAGNOSIS — R1033 Periumbilical pain: Secondary | ICD-10-CM

## 2023-11-14 DIAGNOSIS — K921 Melena: Secondary | ICD-10-CM

## 2023-11-14 DIAGNOSIS — K648 Other hemorrhoids: Secondary | ICD-10-CM | POA: Diagnosis not present

## 2023-11-14 DIAGNOSIS — R197 Diarrhea, unspecified: Secondary | ICD-10-CM | POA: Diagnosis not present

## 2023-11-14 DIAGNOSIS — K219 Gastro-esophageal reflux disease without esophagitis: Secondary | ICD-10-CM

## 2023-11-14 LAB — CBC WITH DIFFERENTIAL/PLATELET
Basophils Absolute: 0 10*3/uL (ref 0.0–0.1)
Basophils Relative: 0.5 % (ref 0.0–3.0)
Eosinophils Absolute: 0 10*3/uL (ref 0.0–0.7)
Eosinophils Relative: 0.7 % (ref 0.0–5.0)
HCT: 44 % (ref 39.0–52.0)
Hemoglobin: 14.8 g/dL (ref 13.0–17.0)
Lymphocytes Relative: 37.7 % (ref 12.0–46.0)
Lymphs Abs: 2.2 10*3/uL (ref 0.7–4.0)
MCHC: 33.7 g/dL (ref 30.0–36.0)
MCV: 82.6 fl (ref 78.0–100.0)
Monocytes Absolute: 0.4 10*3/uL (ref 0.1–1.0)
Monocytes Relative: 6.9 % (ref 3.0–12.0)
Neutro Abs: 3.2 10*3/uL (ref 1.4–7.7)
Neutrophils Relative %: 54.2 % (ref 43.0–77.0)
Platelets: 186 10*3/uL (ref 150.0–400.0)
RBC: 5.32 Mil/uL (ref 4.22–5.81)
RDW: 13.1 % (ref 11.5–15.5)
WBC: 5.9 10*3/uL (ref 4.0–10.5)

## 2023-11-14 LAB — COMPREHENSIVE METABOLIC PANEL WITH GFR
ALT: 22 U/L (ref 0–53)
AST: 15 U/L (ref 0–37)
Albumin: 4.8 g/dL (ref 3.5–5.2)
Alkaline Phosphatase: 112 U/L (ref 39–117)
BUN: 9 mg/dL (ref 6–23)
CO2: 27 meq/L (ref 19–32)
Calcium: 9.2 mg/dL (ref 8.4–10.5)
Chloride: 104 meq/L (ref 96–112)
Creatinine, Ser: 0.64 mg/dL (ref 0.40–1.50)
GFR: 134.64 mL/min (ref >60.00)
Glucose, Bld: 103 mg/dL — ABNORMAL HIGH (ref 70–99)
Potassium: 4.1 meq/L (ref 3.5–5.1)
Sodium: 138 meq/L (ref 135–145)
Total Bilirubin: 0.9 mg/dL (ref 0.2–1.2)
Total Protein: 7.6 g/dL (ref 6.0–8.3)

## 2023-11-14 NOTE — Progress Notes (Signed)
 Discussed the use of AI scribe software for clinical note transcription with the patient, who gave verbal consent to proceed.  HPI : Timothy Pacheco is a 23 year old male who presents with blood in the stool, abdominal pain, nausea, and diarrhea.  He has been experiencing blood in the stool for the past two to three months. The stool is sometimes black and tarry, particularly noted after eating a burger last week. He also observes bright red blood on toilet paper and a reddish tint in the toilet water.  He experiences abdominal pain described as tight cramping around the periumbilical region, ongoing for several months. The pain often prompts a bowel movement, providing immediate relief, although he can feel things 'moving' inside, causing the pain to return. He has bowel movements once or twice a day, which are inconsistent in form, ranging from solid to liquid. No frequent bowel movements exceeding two per day and no issues with constipation/hard stools/straining. He has had close calls with fecal incontinence and was woken up once at 3 AM to use the restroom.  He experiences nausea and diarrhea, with some urgency. He has been taking fiber supplements and has tried medications including Bentyl, Pepcid and Xifaxan, but did not take omeprazole. No improvement with these treatments. He denies using Pepto Bismol or iron tablets which may have caused his stool to be black and denies any over-the-counter pain medications like ibuprofen.  He has transitioned from a diet high in fast food and processed foods to a healthier diet, which he believes has helped his symptoms. He notes a fluctuation in weight, initially gaining and then losing weight with dietary changes.   He has experienced acid reflux, particularly after consuming greasy foods, and has to 'fight the vomit' as food comes back up.  He recalls a significant episode of symptoms a year ago, which prompted an emergency department visit  where a CT scan showed enlarged mesenteric lymph nodes and possible mesenteric adenitis.   CT Abdomen/pelvis December 06, 2022 IMPRESSION: Mildly prominent central and right lower quadrant mesenteric lymph nodes which may reflect mesenteric adenitis      Past Medical History:  Diagnosis Date   GERD (gastroesophageal reflux disease)      History reviewed. No pertinent surgical history. Family History  Problem Relation Age of Onset   Cancer Father    Liver disease Neg Hx    Esophageal cancer Neg Hx    Colon cancer Neg Hx    Social History   Tobacco Use   Smoking status: Never   Smokeless tobacco: Never   Tobacco comments:    Vapes   Vaping Use   Vaping status: Never Used  Substance Use Topics   Alcohol use: Yes    Comment: occ   Drug use: Yes    Types: Marijuana    Comment: vapes   Current Outpatient Medications  Medication Sig Dispense Refill   dicyclomine (BENTYL) 20 MG tablet Take 1 tablet (20 mg total) by mouth 4 (four) times daily as needed (intestinal cramps). (Patient not taking: Reported on 11/14/2023) 40 tablet 0   famotidine (PEPCID) 20 MG tablet Take 1 tablet (20 mg total) by mouth 2 (two) times daily. (Patient not taking: Reported on 11/14/2023) 30 tablet 0   omeprazole (PRILOSEC) 20 MG capsule Take 1 capsule (20 mg total) by mouth daily. (Patient not taking: Reported on 11/14/2023) 30 capsule 3   XIFAXAN 550 MG TABS tablet TAKE 1 TABLET(550 MG) BY MOUTH THREE TIMES DAILY (Patient  not taking: Reported on 11/14/2023) 42 tablet 2   No current facility-administered medications for this visit.   No Known Allergies   Review of Systems: All systems reviewed and negative except where noted in HPI.    No results found.  Physical Exam: BP 122/68   Pulse 73   Ht 5\' 8"  (1.727 m)   Wt 211 lb (95.7 kg)   BMI 32.08 kg/m  Constitutional: Pleasant,well-developed, Hispanic male in no acute distress. HEENT: Normocephalic and atraumatic. Conjunctivae are normal. No  scleral icterus. Neck supple.  Cardiovascular: Normal rate, regular rhythm.  Pulmonary/chest: Effort normal and breath sounds normal. No wheezing, rales or rhonchi. Abdominal: Soft, nondistended, mild tenderness to palpation in the bilateral lower quadrants without rigidity or guarding. Bowel sounds active throughout. There are no masses palpable. No hepatomegaly. Extremities: no edema Neurological: Alert and oriented to person place and time. Skin: Skin is warm and dry. No rashes noted. Psychiatric: Normal mood and affect. Behavior is normal.  CBC    Component Value Date/Time   WBC 7.6 07/22/2023 1446   WBC 7.5 12/06/2022 1403   RBC 5.21 07/22/2023 1446   RBC 4.99 12/06/2022 1403   HGB 14.4 07/22/2023 1446   HCT 43.3 07/22/2023 1446   PLT 223 07/22/2023 1446   MCV 83 07/22/2023 1446   MCH 27.6 07/22/2023 1446   MCH 27.7 12/06/2022 1403   MCHC 33.3 07/22/2023 1446   MCHC 33.3 12/06/2022 1403   RDW 13.0 07/22/2023 1446   LYMPHSABS 2.8 07/22/2023 1446   MONOABS 0.4 12/06/2022 1403   EOSABS 0.1 07/22/2023 1446   BASOSABS 0.0 07/22/2023 1446    CMP     Component Value Date/Time   NA 142 01/16/2023 1508   K 4.1 01/16/2023 1508   CL 103 01/16/2023 1508   CO2 23 01/16/2023 1508   GLUCOSE 81 01/16/2023 1508   GLUCOSE 108 (H) 12/06/2022 1403   BUN 11 01/16/2023 1508   CREATININE 0.66 (L) 01/16/2023 1508   CALCIUM 9.9 01/16/2023 1508   PROT 7.7 01/16/2023 1508   ALBUMIN 5.1 01/16/2023 1508   AST 22 01/16/2023 1508   ALT 48 (H) 01/16/2023 1508   ALKPHOS 120 01/16/2023 1508   BILITOT 0.7 01/16/2023 1508   GFRNONAA >60 12/06/2022 1403       Latest Ref Rng & Units 07/22/2023    2:46 PM 01/16/2023    3:08 PM 12/06/2022    2:03 PM  CBC EXTENDED  WBC 3.4 - 10.8 x10E3/uL 7.6  6.0  7.5   RBC 4.14 - 5.80 x10E6/uL 5.21  5.23  4.99   Hemoglobin 13.0 - 17.7 g/dL 16.1  09.6  04.5   HCT 37.5 - 51.0 % 43.3  43.7  41.4   Platelets 150 - 450 x10E3/uL 223  226  255   NEUT# 1.4 - 7.0  x10E3/uL 4.2  2.5  4.3   Lymph# 0.7 - 3.1 x10E3/uL 2.8  2.9  2.7       ASSESSMENT AND PLAN:   23 year old male with long standing symptoms of abdominal pain and intermittent dark stools and hematochezia.    Melena Intermittent black tarry stools for the past two to three months, possibly suggestive of upper GI bleeding. Possible causes include peptic ulcer disease, significant gastritis, or esophagitis. Associated symptoms include abdominal pain, nausea, and diarrhea. No use of NSAIDs or iron supplements that could contribute to melena. Dietary changes and medications like Bentyl and Pepcid have not provided significant improvement. - Order repeat blood  count to assess hemoglobin levels and CMP to check blood urea nitrogen levels. - Order H. pylori stool test. - Consider upper endoscopy if lab tests suggest any evidence of upper GI bleeding. - Recommend he start taking the omeprazole he has already been prescribed.  Irritable Bowel Syndrome (IBS) Chronic crampy abdominal pain, loose stools, and urgency suggestive of IBS. Symptoms have been ongoing for months, with pain typically relieved by bowel movements. Dietary changes, reducing fast food intake, have helped somewhat. IBS is considered a common cause of these symptoms, but inflammatory bowel disease is also a differential diagnosis. - Order fecal calprotectin to evaluate for inflammatory bowel disease. - Colonoscopy if calprotectin elevated..  Internal Hemorrhoids Bright red blood on toilet paper and in toilet water, likely due to internal hemorrhoids. Sitting on the toilet for extended periods in the past could exacerbate hemorrhoid symptoms. Internal hemorrhoids are common and can cause bleeding, especially with increased bowel movements or straining.  Gastroesophageal Reflux Disease (GERD) Symptoms of acid reflux, especially after consuming greasy foods, suggest GERD. No response to Pepcid, ut he has not started taking  omeprazole. - Advise starting omeprazole (Prilosec) as previously prescribed.  Kayode Petion E. Tomasa Rand, MD [facility]  .sectim    Grayce Sessions, NP

## 2023-11-14 NOTE — Patient Instructions (Addendum)
 Your provider has requested that you go to the basement level for lab work before leaving today. Press "B" on the elevator. The lab is located at the first door on the left as you exit the elevator.  _______________________________________________________  If your blood pressure at your visit was 140/90 or greater, please contact your primary care physician to follow up on this.  _______________________________________________________  If you are age 22 or older, your body mass index should be between 23-30. Your Body mass index is 32.08 kg/m. If this is out of the aforementioned range listed, please consider follow up with your Primary Care Provider.  If you are age 49 or younger, your body mass index should be between 19-25. Your Body mass index is 32.08 kg/m. If this is out of the aformentioned range listed, please consider follow up with your Primary Care Provider.   ________________________________________________________  The [facility] providers would like to encourage you to use [facility] to communicate with providers for non-urgent requests or questions.  Due to long hold times on the telephone, sending your provider a message by [facility] may be a faster and more efficient way to get a response.  Please allow 48 business hours for a response.  Please remember that this is for non-urgent requests.  _______________________________________________________   Due to recent changes in healthcare laws, you may see the results of your imaging and laboratory studies on MyChart before your provider has had a chance to review them.  We understand that in some cases there may be results that are confusing or concerning to you. Not all laboratory results come back in the same time frame and the provider may be waiting for multiple results in order to interpret others.  Please give [place] 48 hours in order for your provider to thoroughly review all the results before contacting the office for clarification of  your results.    It was a pleasure to see you today!  Thank you for trusting me with your gastrointestinal care!    Scott E.Tomasa Rand, MD

## 2023-11-15 LAB — IGA: Immunoglobulin A: 255 mg/dL (ref 47–310)

## 2023-11-15 LAB — TISSUE TRANSGLUTAMINASE, IGA: (tTG) Ab, IgA: <1 U/mL

## 2023-11-16 LAB — H. PYLORI ANTIGEN, STOOL: H pylori Ag, Stl: NEGATIVE

## 2023-11-16 LAB — CALPROTECTIN, FECAL: Calprotectin, Fecal: 9 ug/g (ref 0–120)

## 2023-11-19 ENCOUNTER — Encounter: Payer: Self-pay | Admitting: Gastroenterology

## 2023-11-19 DIAGNOSIS — K219 Gastro-esophageal reflux disease without esophagitis: Secondary | ICD-10-CM

## 2023-11-19 MED ORDER — OMEPRAZOLE 20 MG PO CPDR: 20.0000 mg | DELAYED_RELEASE_CAPSULE | Freq: Every day | ORAL | 2 refills | Status: DC

## 2023-11-19 NOTE — Progress Notes (Signed)
 Timothy Pacheco, All of your labs looked great.  Your hemoglobin level was at your baseline.  It is extremely unlikely that the dark stools that you are experiencing represent blood. Testing for H. pylori was negative.  Testing for celiac disease was negative. Your fecal calprotectin was normal, indicating the absence of inflammation in your GI tract.  Your chronic abdominal pain loose stools and urgency are most consistent with irritable bowel syndrome. Your upper GI symptoms seem most consistent with GERD.  Please start taking the omeprazole as discussed. The bright red blood that you are seeing is almost certainly from internal hemorrhoids. Please follow-up with me in the office in 2-3 months to reassess your symptoms and discuss further management of IBS.  Maya,  Can you please book a follow-up visit?

## 2024-01-02 ENCOUNTER — Telehealth: Payer: Self-pay | Admitting: Gastroenterology

## 2024-01-02 ENCOUNTER — Other Ambulatory Visit: Payer: Self-pay

## 2024-01-02 DIAGNOSIS — K219 Gastro-esophageal reflux disease without esophagitis: Secondary | ICD-10-CM

## 2024-01-02 MED ORDER — OMEPRAZOLE 20 MG PO CPDR: 20.0000 mg | DELAYED_RELEASE_CAPSULE | Freq: Every day | ORAL | 2 refills | Status: DC

## 2024-01-02 NOTE — Telephone Encounter (Signed)
Prescription resent to patient's pharmacy. 

## 2024-01-02 NOTE — Telephone Encounter (Signed)
 Inbound call from patient, states he did not pick up the omperazole from the pharmacy and they placed it back, patient would like rx re called in.

## 2024-01-28 ENCOUNTER — Ambulatory Visit (INDEPENDENT_AMBULATORY_CARE_PROVIDER_SITE_OTHER): Admitting: Gastroenterology

## 2024-01-28 ENCOUNTER — Encounter: Payer: Self-pay | Admitting: Gastroenterology

## 2024-01-28 VITALS — BP 120/70 | HR 88 | Ht 68.0 in | Wt 211.0 lb

## 2024-01-28 DIAGNOSIS — K589 Irritable bowel syndrome without diarrhea: Secondary | ICD-10-CM

## 2024-01-28 DIAGNOSIS — K219 Gastro-esophageal reflux disease without esophagitis: Secondary | ICD-10-CM | POA: Diagnosis not present

## 2024-01-28 DIAGNOSIS — K580 Irritable bowel syndrome with diarrhea: Secondary | ICD-10-CM

## 2024-01-28 MED ORDER — OMEPRAZOLE 20 MG PO CPDR: DELAYED_RELEASE_CAPSULE | ORAL | 0 refills | Status: AC

## 2024-01-28 MED ORDER — DICYCLOMINE HCL 20 MG PO TABS: 20.0000 mg | ORAL_TABLET | Freq: Four times a day (QID) | ORAL | 3 refills | Status: AC | PRN

## 2024-01-28 NOTE — Patient Instructions (Addendum)
 Take Omeprazole  every other day for 2 weeks. If symptoms don't return after then start Pepcid  as needed.   We have sent the following medications to your pharmacy for you to pick up at your convenience: Omeprazole  every other day for 2 weeks. Bentyl  every 6 hours as needed.   _______________________________________________________  If your blood pressure at your visit was 140/90 or greater, please contact your primary care physician to follow up on this.  _______________________________________________________  If you are age 22 or older, your body mass index should be between 23-30. Your Body mass index is 32.08 kg/m. If this is out of the aforementioned range listed, please consider follow up with your Primary Care Provider.  If you are age 40 or younger, your body mass index should be between 19-25. Your Body mass index is 32.08 kg/m. If this is out of the aformentioned range listed, please consider follow up with your Primary Care Provider.   ________________________________________________________  The [facility] providers would like to encourage you to use MYCHART to communicate with providers for non-urgent requests or questions.  Due to long hold times on the telephone, sending your provider a message by [facility] may be a faster and more efficient way to get a response.  Please allow 48 business hours for a response.  Please remember that this is for non-urgent requests.  _______________________________________________________

## 2024-01-28 NOTE — Progress Notes (Signed)
 Discussed the use of AI scribe software for clinical note transcription with the patient, who gave verbal consent to proceed.  HPI :  Timothy Pacheco is a 23 year old male with irritable bowel syndrome who presents for follow-up of gastrointestinal symptoms.  Over the past couple of months, he has experienced improvement in his gastrointestinal symptoms. Cramps have become more infrequent, and stools have transitioned from mushy to a more solid form. He attributes this improvement to dietary changes, although he acknowledges difficulty in consistently avoiding processed and fast foods.  He has bowel movements once or twice daily, which is more frequent than before, and describes them as formed and solid. Symptoms, including cramps, are influenced by dietary choices, with cramps occurring approximately twice a week, often after consuming certain foods. He identifies that eating foods he should avoid leads to these symptoms, but he describes this as 'willful ignorance' and acknowledges that it is avoidable.  He is currently taking omeprazole  daily before a meal. He has not experienced acid reflux or nausea recently. His weight remains stable, fluctuating around 200 pounds.  He previously experienced hemorrhoid symptoms, specifically bright red blood on toilet paper, but these symptoms have resolved.   Negative tests for H. Pylori, celiac disease, normal calprotectin in April 2025.     Past Medical History:  Diagnosis Date   GERD (gastroesophageal reflux disease)     History reviewed. No pertinent surgical history. Family History  Problem Relation Age of Onset   Cancer Father    Liver disease Neg Hx    Esophageal cancer Neg Hx    Colon cancer Neg Hx    Social History   Tobacco Use   Smoking status: Never   Smokeless tobacco: Never   Tobacco comments:    Vapes   Vaping Use   Vaping status: Never Used  Substance Use Topics   Alcohol use: Yes    Comment: occ   Drug use:  Yes    Types: Marijuana    Comment: vapes   Current Outpatient Medications  Medication Sig Dispense Refill   omeprazole  (PRILOSEC) 20 MG capsule Take 1 capsule (20 mg total) by mouth daily. 30 capsule 2   No current facility-administered medications for this visit.   No Known Allergies   Review of Systems: All systems reviewed and negative except where noted in HPI.    No results found.  Physical Exam: BP 120/70   Pulse 88   Ht 5' 8 (1.727 m)   Wt 211 lb (95.7 kg)   SpO2 97%   BMI 32.08 kg/m  Constitutional: Pleasant,well-developed, Hispanic male in no acute distress. HEENT: Normocephalic and atraumatic. Conjunctivae are normal. No scleral icterus. Neurological: Alert and oriented to person place and time. Skin: Skin is warm and dry. No rashes noted. Psychiatric: Normal mood and affect. Behavior is normal.  CBC    Component Value Date/Time   WBC 5.9 11/14/2023 0947   RBC 5.32 11/14/2023 0947   HGB 14.8 11/14/2023 0947   HGB 14.4 07/22/2023 1446   HCT 44.0 11/14/2023 0947   HCT 43.3 07/22/2023 1446   PLT 186.0 11/14/2023 0947   PLT 223 07/22/2023 1446   MCV 82.6 11/14/2023 0947   MCV 83 07/22/2023 1446   MCH 27.6 07/22/2023 1446   MCH 27.7 12/06/2022 1403   MCHC 33.7 11/14/2023 0947   RDW 13.1 11/14/2023 0947   RDW 13.0 07/22/2023 1446   LYMPHSABS 2.2 11/14/2023 0947   LYMPHSABS 2.8 07/22/2023 1446   MONOABS  0.4 11/14/2023 0947   EOSABS 0.0 11/14/2023 0947   EOSABS 0.1 07/22/2023 1446   BASOSABS 0.0 11/14/2023 0947   BASOSABS 0.0 07/22/2023 1446    CMP     Component Value Date/Time   NA 138 11/14/2023 0947   NA 142 01/16/2023 1508   K 4.1 11/14/2023 0947   CL 104 11/14/2023 0947   CO2 27 11/14/2023 0947   GLUCOSE 103 (H) 11/14/2023 0947   BUN 9 11/14/2023 0947   BUN 11 01/16/2023 1508   CREATININE 0.64 11/14/2023 0947   CALCIUM 9.2 11/14/2023 0947   PROT 7.6 11/14/2023 0947   PROT 7.7 01/16/2023 1508   ALBUMIN 4.8 11/14/2023 0947   ALBUMIN  5.1 01/16/2023 1508   AST 15 11/14/2023 0947   ALT 22 11/14/2023 0947   ALKPHOS 112 11/14/2023 0947   BILITOT 0.9 11/14/2023 0947   BILITOT 0.7 01/16/2023 1508   GFRNONAA >60 12/06/2022 1403       Latest Ref Rng & Units 11/14/2023    9:47 AM 07/22/2023    2:46 PM 01/16/2023    3:08 PM  CBC EXTENDED  WBC 4.0 - 10.5 K/uL 5.9  7.6  6.0   RBC 4.22 - 5.81 Mil/uL 5.32  5.21  5.23   Hemoglobin 13.0 - 17.0 g/dL 78.2  95.6  21.3   HCT 39.0 - 52.0 % 44.0  43.3  43.7   Platelets 150.0 - 400.0 K/uL 186.0  223  226   NEUT# 1.4 - 7.7 K/uL 3.2  4.2  2.5   Lymph# 0.7 - 4.0 K/uL 2.2  2.8  2.9       ASSESSMENT AND PLAN:  23 year old male with IBS and GERD, with improvement in symptoms with dietary modifications and PPI.  Irritable Bowel Syndrome Symptom improvement noted. Symptoms influenced by dietary choices. Previous tests normal, supporting IBS diagnosis. Still having occasional crampy pain due to dietary indiscretions.  Discussed dicyclomine  for crampy pain. Emphasized dietary triggers cause discomfort but not damage. - Prescribe dicyclomine  20 mg PO every 6 hours as needed for pain, 30 tablets, 3 refills.  Gastroesophageal Reflux Disease (GERD) Symptoms managed with omeprazole . Plan to taper omeprazole  to assess symptom recurrence. Discussed potential rebound acid production. - Take omeprazole  every other day for two weeks. - Stop omeprazole  if symptoms infrequent after tapering. - Use Pepcid  as needed if symptoms return after stopping omeprazole .  Recording duration: 8 minutes     Willer Osorno E. Cherryl Corona, MD [facility]    Marius Siemens, NP

## 2024-06-23 ENCOUNTER — Encounter (HOSPITAL_COMMUNITY): Payer: Self-pay

## 2024-06-23 ENCOUNTER — Ambulatory Visit (HOSPITAL_COMMUNITY): Admission: EM | Admit: 2024-06-23 | Discharge: 2024-06-23 | Disposition: A | Discharge status: Home / Self Care

## 2024-06-23 DIAGNOSIS — L259 Unspecified contact dermatitis, unspecified cause: Secondary | ICD-10-CM

## 2024-06-23 MED ORDER — METHYLPREDNISOLONE 4 MG PO TBPK: ORAL_TABLET | ORAL | 0 refills | Status: AC

## 2024-06-23 NOTE — ED Triage Notes (Signed)
 Patient reports that he started out with a large area of rash/itching on his right shin a week ago and is now having small areas of rash all over his body.  Patient denies using or taking any medications for his symptoms.

## 2024-06-23 NOTE — ED Provider Notes (Signed)
 [facility]    CSN: [id number] Arrival date & time: 06/23/24  1420      History   Chief Complaint Chief Complaint  Patient presents with   Rash   Pruritis    HPI Timothy Pacheco is a 23 y.o. male.   Pt presents today due to 1 week of erythematous and pruritic eruption that started on right anterior shin and has started to spread to arms. Pt states that in the last month he has changed laundry detergent and body wash. He switch to a free and clear laundry detergent and switched from liquid dove to bar soap dove. Pt denies contact with plants, animals, insects, or changes in medication.   The history is provided by the patient.  Rash   Past Medical History:  Diagnosis Date   GERD (gastroesophageal reflux disease)     There are no active problems to display for this patient.   History reviewed. No pertinent surgical history.     Home Medications    Prior to Admission medications   Medication Sig Start Date End Date Taking? Authorizing Provider  methylPREDNISolone (MEDROL DOSEPAK) 4 MG TBPK tablet Take as directed on back of package 06/23/24  Yes Andra Corean BROCKS, PA-C  dicyclomine  (BENTYL ) 20 MG tablet Take 1 tablet (20 mg total) by mouth every 6 (six) hours as needed for spasms. Patient not taking: Reported on 06/23/2024 01/28/24   Stacia Glendia BRAVO, MD  omeprazole  (PRILOSEC) 20 MG capsule Take one tablet every other day for 2 weeks. Patient not taking: Reported on 06/23/2024 01/28/24   Stacia Glendia BRAVO, MD    Family History Family History  Problem Relation Age of Onset   Cancer Father    Liver disease Neg Hx    Esophageal cancer Neg Hx    Colon cancer Neg Hx     Social History Social History   Tobacco Use   Smoking status: Never   Smokeless tobacco: Never   Tobacco comments:    Vapes   Vaping Use   Vaping status: Former  Substance Use Topics   Alcohol use: Yes    Comment: occ   Drug use: Yes    Types: Marijuana      Allergies   Patient has no known allergies.   Review of Systems Review of Systems  Skin:  Positive for rash.     Physical Exam Triage Vital Signs ED Triage Vitals [06/23/24 1554]  Encounter Vitals Group     BP 121/69     Girls Systolic BP Percentile      Girls Diastolic BP Percentile      Boys Systolic BP Percentile      Boys Diastolic BP Percentile      Pulse Rate 64     Resp 14     Temp 97.9 F (36.6 C)     Temp Source Oral     SpO2 97 %     Weight      Height      Head Circumference      Peak Flow      Pain Score 0     Pain Loc      Pain Education      Exclude from Growth Chart    No data found.  Updated Vital Signs BP 121/69 (BP Location: Left Arm)   Pulse 64   Temp 97.9 F (36.6 C) (Oral)   Resp 14   SpO2 97%   Visual Acuity Right Eye Distance:  Left Eye Distance:   Bilateral Distance:    Right Eye Near:   Left Eye Near:    Bilateral Near:     Physical Exam Vitals and nursing note reviewed.  Constitutional:      General: He is not in acute distress.    Appearance: Normal appearance. He is not ill-appearing, toxic-appearing or diaphoretic.  Eyes:     General: No scleral icterus. Cardiovascular:     Rate and Rhythm: Normal rate and regular rhythm.     Heart sounds: Normal heart sounds.  Pulmonary:     Effort: Pulmonary effort is normal. No respiratory distress.     Breath sounds: Normal breath sounds. No wheezing or rhonchi.  Skin:    General: Skin is warm.     Findings: Rash present. Rash is macular and papular.  Neurological:     Mental Status: He is alert and oriented to person, place, and time.  Psychiatric:        Mood and Affect: Mood normal.        Behavior: Behavior normal.      UC Treatments / Results  Labs (all labs ordered are listed, but only abnormal results are displayed) Labs Reviewed - No data to display  EKG   Radiology No results found.  Procedures Procedures (including critical care  time)  Medications Ordered in UC Medications - No data to display  Initial Impression / Assessment and Plan / UC Course  I have reviewed the triage vital signs and the nursing notes.  Pertinent labs & imaging results that were available during my care of the patient were reviewed by me and considered in my medical decision making (see chart for details).     Final Clinical Impressions(s) / UC Diagnoses   Final diagnoses:  Contact dermatitis, unspecified contact dermatitis type, unspecified trigger   Discharge Instructions   None    ED Prescriptions     Medication Sig Dispense Auth. Provider   methylPREDNISolone (MEDROL DOSEPAK) 4 MG TBPK tablet Take as directed on back of package 21 tablet Andra Corean BROCKS, PA-C      PDMP not reviewed this encounter.   Andra Corean BROCKS, PA-C 06/23/24 1623

## 2024-08-15 ENCOUNTER — Encounter (HOSPITAL_COMMUNITY): Payer: Self-pay | Admitting: *Deleted

## 2024-08-15 ENCOUNTER — Ambulatory Visit (HOSPITAL_COMMUNITY)
Admission: EM | Admit: 2024-08-15 | Discharge: 2024-08-15 | Disposition: A | Discharge status: Home / Self Care | Attending: Emergency Medicine | Admitting: Emergency Medicine

## 2024-08-15 DIAGNOSIS — R52 Pain, unspecified: Secondary | ICD-10-CM | POA: Diagnosis not present

## 2024-08-15 DIAGNOSIS — J029 Acute pharyngitis, unspecified: Secondary | ICD-10-CM | POA: Diagnosis not present

## 2024-08-15 DIAGNOSIS — J020 Streptococcal pharyngitis: Secondary | ICD-10-CM | POA: Diagnosis not present

## 2024-08-15 LAB — POCT RAPID STREP A (OFFICE): Rapid Strep A Screen: POSITIVE — AB

## 2024-08-15 LAB — POCT INFLUENZA A/B
Influenza A, POC: NEGATIVE
Influenza B, POC: NEGATIVE

## 2024-08-15 MED ORDER — ONDANSETRON 4 MG PO TBDP: 4.0000 mg | ORAL_TABLET | Freq: Three times a day (TID) | ORAL | 0 refills | Status: AC | PRN

## 2024-08-15 MED ORDER — AMOXICILLIN 500 MG PO CAPS: 500.0000 mg | ORAL_CAPSULE | Freq: Two times a day (BID) | ORAL | 0 refills | Status: AC

## 2024-08-15 NOTE — Discharge Instructions (Addendum)
 Take the antibiotics twice daily with food for the next 10 days.  Avoid sharing food or any saliva until you have been on antibiotics for at least 24 hours as strep throat is contagious.  I suggest throwing out your toothbrush in the next day or so as well.  Alternate between 100 mg of ibuprofen and 500 mg of Tylenol every 4-6 hours to help with any fever, body aches or chills Use Zofran  every 8 hours to help with any nausea or vomiting and follow-up bland diet, guidance attached  Symptoms should improve over the next few days with antibiotics.  If no improvement or any changes seek follow-up care.

## 2024-08-15 NOTE — ED Provider Notes (Signed)
 " [facility]    CSN: [id number] Arrival date & time: 08/15/24  0825      History   Chief Complaint Chief Complaint  Patient presents with   Fever   Chills   Headache   Cough   Nasal Congestion   Diarrhea    HPI Timothy Pacheco is a 24 y.o. male.   Patient presents to clinic over concern of generalized bodyaches, chills, fever, nausea, vomiting, diarrhea, cough and congestion.  Symptoms started yesterday abruptly upon wakening.  Did take a home COVID-19 test and this was negative.  Has tried DayQuil and NyQuil.  Has not had any wheezing or shortness of breath.  Lives with his boyfriend who is not currently sick with similar symptoms.  Denies recent sick contacts.  Has had a sore throat as well.  Reports his tonsils are usually enlarged and has had tonsil stones in the past.    The history is provided by the patient and medical records.  Fever Headache Cough Diarrhea   Past Medical History:  Diagnosis Date   GERD (gastroesophageal reflux disease)     There are no active problems to display for this patient.   History reviewed. No pertinent surgical history.     Home Medications    Prior to Admission medications  Medication Sig Start Date End Date Taking? Authorizing Provider  amoxicillin  (AMOXIL ) 500 MG capsule Take 1 capsule (500 mg total) by mouth 2 (two) times daily for 10 days. 08/15/24 08/25/24 Yes Justen Fonda  N, FNP  ondansetron  (ZOFRAN -ODT) 4 MG disintegrating tablet Take 1 tablet (4 mg total) by mouth every 8 (eight) hours as needed for nausea or vomiting. 08/15/24  Yes Adessa Primiano  N, FNP  dicyclomine  (BENTYL ) 20 MG tablet Take 1 tablet (20 mg total) by mouth every 6 (six) hours as needed for spasms. Patient not taking: Reported on 06/23/2024 01/28/24   Stacia Glendia BRAVO, MD  methylPREDNISolone  (MEDROL  DOSEPAK) 4 MG TBPK tablet Take as directed on back of package 06/23/24   Andra Corean BROCKS, PA-C  omeprazole  (PRILOSEC)  20 MG capsule Take one tablet every other day for 2 weeks. Patient not taking: Reported on 06/23/2024 01/28/24   Stacia Glendia BRAVO, MD    Family History Family History  Problem Relation Age of Onset   Cancer Father    Liver disease Neg Hx    Esophageal cancer Neg Hx    Colon cancer Neg Hx     Social History Social History[1]   Allergies   Patient has no known allergies.   Review of Systems Review of Systems  Per HPI  Physical Exam Triage Vital Signs ED Triage Vitals [08/15/24 0907]  Encounter Vitals Group     BP 124/83     Girls Systolic BP Percentile      Girls Diastolic BP Percentile      Boys Systolic BP Percentile      Boys Diastolic BP Percentile      Pulse Rate 87     Resp 16     Temp 100.1 F (37.8 C)     Temp Source Oral     SpO2 97 %     Weight      Height      Head Circumference      Peak Flow      Pain Score 4     Pain Loc      Pain Education      Exclude from Growth Chart    No data  found.  Updated Vital Signs BP 124/83 (BP Location: Right Arm)   Pulse 87   Temp 100.1 F (37.8 C) (Oral)   Resp 16   SpO2 97%   Visual Acuity Right Eye Distance:   Left Eye Distance:   Bilateral Distance:    Right Eye Near:   Left Eye Near:    Bilateral Near:     Physical Exam Vitals and nursing note reviewed.  Constitutional:      Appearance: Normal appearance.  HENT:     Head: Normocephalic and atraumatic.     Right Ear: External ear normal.     Left Ear: External ear normal.     Nose: Nose normal.     Mouth/Throat:     Mouth: Mucous membranes are moist.     Pharynx: Uvula midline. Posterior oropharyngeal erythema present.     Tonsils: Tonsillar exudate present. 3+ on the right. 3+ on the left.  Eyes:     Conjunctiva/sclera: Conjunctivae normal.  Cardiovascular:     Rate and Rhythm: Normal rate and regular rhythm.     Heart sounds: Normal heart sounds. No murmur heard. Pulmonary:     Effort: Pulmonary effort is normal. No respiratory  distress.     Breath sounds: Normal breath sounds. No wheezing.  Skin:    General: Skin is warm and dry.  Neurological:     General: No focal deficit present.     Mental Status: He is alert.  Psychiatric:        Mood and Affect: Mood normal.        Behavior: Behavior is cooperative.      UC Treatments / Results  Labs (all labs ordered are listed, but only abnormal results are displayed) Labs Reviewed  POCT RAPID STREP A (OFFICE) - Abnormal; Notable for the following components:      Result Value   Rapid Strep A Screen Positive (*)    All other components within normal limits  POCT INFLUENZA A/B    EKG   Radiology No results found.  Procedures Procedures (including critical care time)  Medications Ordered in UC Medications - No data to display  Initial Impression / Assessment and Plan / UC Course  I have reviewed the triage vital signs and the nursing notes.  Pertinent labs & imaging results that were available during my care of the patient were reviewed by me and considered in my medical decision making (see chart for details).  Vitals and triage reviewed, patient is hemodynamically stable.  Lungs vesicular, heart with regular rate and rhythm.  Congestion and rhinorrhea present.  Tonsils reveal exudate with 3+ swelling bilaterally.  Uvula midline.  Low concern for PTA.  Controlling secretions and without difficulty swallowing.  POC rapid strep positive, will treat with amoxicillin  twice daily x 10 days for strep pharyngitis.  Influenza testing negative.  With nausea and diarrhea will trial Zofran  and encouraged bland diet.  Plan of care, follow-up care return precautions given, no questions at this time.    Final Clinical Impressions(s) / UC Diagnoses   Final diagnoses:  Generalized body aches  Acute pharyngitis, unspecified etiology  Strep pharyngitis     Discharge Instructions      Take the antibiotics twice daily with food for the next 10 days.  Avoid  sharing food or any saliva until you have been on antibiotics for at least 24 hours as strep throat is contagious.  I suggest throwing out your toothbrush in the next day or so as  well.  Alternate between 100 mg of ibuprofen and 500 mg of Tylenol every 4-6 hours to help with any fever, body aches or chills Use Zofran  every 8 hours to help with any nausea or vomiting and follow-up bland diet, guidance attached  Symptoms should improve over the next few days with antibiotics.  If no improvement or any changes seek follow-up care.      ED Prescriptions     Medication Sig Dispense Auth. Provider   ondansetron  (ZOFRAN -ODT) 4 MG disintegrating tablet Take 1 tablet (4 mg total) by mouth every 8 (eight) hours as needed for nausea or vomiting. 20 tablet Dreama, Rondy Krupinski  N, FNP   amoxicillin  (AMOXIL ) 500 MG capsule Take 1 capsule (500 mg total) by mouth 2 (two) times daily for 10 days. 20 capsule Dreama, Skylen Spiering  N, FNP      PDMP not reviewed this encounter.     [1]  Social History Tobacco Use   Smoking status: Never   Smokeless tobacco: Never   Tobacco comments:    Vapes   Vaping Use   Vaping status: Former  Substance Use Topics   Alcohol use: Yes    Comment: occ   Drug use: Yes    Types: Marijuana     Dreama, Danijela Vessey  N, FNP 08/15/24 1008  "

## 2024-08-15 NOTE — ED Triage Notes (Signed)
 Pt states he has had body aches, chills, fever, diarrhea, cough and congestion since yesterday. He has been taking dayquil and nyquil
